# Patient Record
Sex: Female | Born: 1940 | Race: White | Hispanic: No | Marital: Married | Smoking: Never smoker
Health system: Southern US, Community
[De-identification: ages and names within clinical notes are randomized; demographics above are authoritative.]

## PROBLEM LIST (undated history)

## (undated) DIAGNOSIS — IMO0001 Reserved for inherently not codable concepts without codable children: Secondary | ICD-10-CM

## (undated) DIAGNOSIS — M1990 Unspecified osteoarthritis, unspecified site: Secondary | ICD-10-CM

## (undated) DIAGNOSIS — K589 Irritable bowel syndrome without diarrhea: Secondary | ICD-10-CM

## (undated) DIAGNOSIS — C4490 Unspecified malignant neoplasm of skin, unspecified: Secondary | ICD-10-CM

## (undated) DIAGNOSIS — I639 Cerebral infarction, unspecified: Secondary | ICD-10-CM

## (undated) DIAGNOSIS — G2581 Restless legs syndrome: Secondary | ICD-10-CM

## (undated) DIAGNOSIS — F419 Anxiety disorder, unspecified: Secondary | ICD-10-CM

## (undated) DIAGNOSIS — M25473 Effusion, unspecified ankle: Secondary | ICD-10-CM

## (undated) DIAGNOSIS — R3915 Urgency of urination: Secondary | ICD-10-CM

## (undated) DIAGNOSIS — I253 Aneurysm of heart: Secondary | ICD-10-CM

## (undated) DIAGNOSIS — K219 Gastro-esophageal reflux disease without esophagitis: Secondary | ICD-10-CM

## (undated) DIAGNOSIS — E785 Hyperlipidemia, unspecified: Secondary | ICD-10-CM

## (undated) DIAGNOSIS — I4891 Unspecified atrial fibrillation: Secondary | ICD-10-CM

## (undated) DIAGNOSIS — I10 Essential (primary) hypertension: Secondary | ICD-10-CM

## (undated) HISTORY — PX: ABDOMINAL HYSTERECTOMY: SHX81

## (undated) HISTORY — PX: EYE SURGERY: SHX253

## (undated) HISTORY — DX: Unspecified atrial fibrillation: I48.91

## (undated) HISTORY — PX: TOTAL KNEE ARTHROPLASTY: SHX125

## (undated) HISTORY — DX: Irritable bowel syndrome, unspecified: K58.9

## (undated) HISTORY — PX: TONSILLECTOMY: SUR1361

## (undated) HISTORY — PX: FOOT SURGERY: SHX648

## (undated) HISTORY — DX: Aneurysm of heart: I25.3

## (undated) HISTORY — PX: APPENDECTOMY: SHX54

## (undated) HISTORY — DX: Essential (primary) hypertension: I10

## (undated) HISTORY — PX: COLONOSCOPY: SHX174

---

## 2000-02-11 ENCOUNTER — Inpatient Hospital Stay (HOSPITAL_COMMUNITY): Admission: RE | Admit: 2000-02-11 | Discharge: 2000-02-16 | Payer: Self-pay | Admitting: Orthopaedic Surgery

## 2004-07-25 ENCOUNTER — Ambulatory Visit (HOSPITAL_COMMUNITY): Admission: RE | Admit: 2004-07-25 | Discharge: 2004-07-26 | Payer: Self-pay | Admitting: Orthopaedic Surgery

## 2011-11-19 ENCOUNTER — Encounter: Payer: Self-pay | Admitting: *Deleted

## 2011-11-19 ENCOUNTER — Encounter: Payer: Self-pay | Admitting: Cardiology

## 2011-11-20 ENCOUNTER — Ambulatory Visit (INDEPENDENT_AMBULATORY_CARE_PROVIDER_SITE_OTHER): Payer: Medicare Other | Admitting: Cardiology

## 2011-11-20 ENCOUNTER — Encounter: Payer: Self-pay | Admitting: Cardiology

## 2011-11-20 VITALS — BP 125/74 | HR 69 | Ht 65.0 in | Wt 190.0 lb

## 2011-11-20 DIAGNOSIS — I10 Essential (primary) hypertension: Secondary | ICD-10-CM | POA: Insufficient documentation

## 2011-11-20 DIAGNOSIS — R55 Syncope and collapse: Secondary | ICD-10-CM | POA: Insufficient documentation

## 2011-11-20 DIAGNOSIS — I4891 Unspecified atrial fibrillation: Secondary | ICD-10-CM | POA: Insufficient documentation

## 2011-11-20 NOTE — Patient Instructions (Signed)
Your physician recommends that you schedule a follow-up appointment in: 8 weeks in [place]

## 2011-11-20 NOTE — Assessment & Plan Note (Signed)
Etiology unclear. LV function normal. No events in the past 6-7 months. She may require a loop in the future if she has recurrences.

## 2011-11-20 NOTE — Assessment & Plan Note (Signed)
Blood pressure controlled. Continue present medications. 

## 2011-11-20 NOTE — Assessment & Plan Note (Signed)
Patient carries the diagnosis of atrial fibrillation. However in reviewing her Holter report it states she had 70 beats of supraventricular ectopic activity. 44 were in 6 runs. Office notes did state it appeared to be atrial fibrillation. These at most would be very short runs. I will obtain actual Holter monitor to review before diagnosing with atrial fibrillation. If she does have this she has multiple embolic risk factors including age greater than 36, hypertension and female sex. She would therefore require one of the new anticoagulants. We will see her back in 8 weeks and make that decision.

## 2011-11-20 NOTE — Progress Notes (Signed)
  HPI: 71 year old female previously followed by [facility] cardiology for evaluation of atrial fibrillation. Patient had a stress MRI in the past that showed an atrial septal aneurysm but there was no ischemia. An echocardiogram in July of 2012 apparently showed no shunt. There was normal LV function. No significant valvular disease noted. A monitor in July of 2012 was performed because of recurrent falls and showed supraventricular ectopic activity of 70 beats. 44 were in 6 runs and there were atrial couplets. Based on outside office notes there was possible atrial fibrillation with a rapid ventricular response. Patient denies dyspnea on exertion, orthopnea, PND or exertional chest pain. Occasional mild pedal edema. She does occasionally have palpitations predominantly with exertion. She has had 3 falls previously. Her most recent was approximately 6-7 months ago. She was pushing her trash can and does not recall the exact events. She did fall but is unclear if she lost consciousness. No associated chest pain, palpitations, dyspnea, nausea, incontinence. She now presents for further evaluation.  Current Outpatient Prescriptions  Medication Sig Dispense Refill  . CELEXA 20 MG tablet       . hydrochlorothiazide (HYDRODIURIL) 50 MG tablet       . HYDROcodone-acetaminophen (VICODIN) 5-500 MG per tablet       . KLOR-CON M20 20 MEQ tablet       . metoprolol succinate (TOPROL-XL) 25 MG 24 hr tablet       . PREMARIN 0.45 MG tablet       . VESICARE 5 MG tablet         No Known Allergies  Past Medical History  Diagnosis Date  . [place]ath-induced atrial fibrillation   . HTN (hypertension)   . Atrial septal aneurysm     No past surgical history on file.  History   Social History  . Marital Status: Married    Spouse Name: N/A    Number of Children: N/A  . Years of Education: N/A   Occupational History  . Not on file.   Social History Main Topics  . Smoking status: Not on file  . Smokeless  tobacco: Not on file  . Alcohol Use: Not on file  . Drug Use: Not on file  . Sexually Active: Not on file   Other Topics Concern  . Not on file   Social History Narrative  . No narrative on file    No family history on file.  ROS: no fevers or chills, productive cough, hemoptysis, dysphasia, odynophagia, melena, hematochezia, dysuria, hematuria, rash, seizure activity, orthopnea, PND, pedal edema, claudication. Remaining systems are negative.  Physical Exam:   There were no vitals taken for this visit.  General:  Well developed/well nourished in NAD Skin warm/dry Patient not depressed No peripheral clubbing Back-normal HEENT-normal/normal eyelids Neck supple/normal carotid upstroke bilaterally; no bruits; no JVD; no thyromegaly chest - CTA/ normal expansion CV - RRR/normal S1 and S2; no murmurs, rubs or gallops;  PMI nondisplaced Abdomen -NT/ND, no HSM, no mass, + bowel sounds, no bruit 2+ femoral pulses, no bruits Ext-no edema, chords, 2+ DP Neuro-grossly nonfocal  ECG normal sinus rhythm at a rate of 60. No significant ST changes. Low voltage.

## 2012-02-03 ENCOUNTER — Encounter: Payer: Self-pay | Admitting: Cardiology

## 2012-02-03 ENCOUNTER — Ambulatory Visit (INDEPENDENT_AMBULATORY_CARE_PROVIDER_SITE_OTHER): Payer: Medicare Other | Admitting: Cardiology

## 2012-02-03 VITALS — BP 120/78 | HR 72 | Ht 65.0 in | Wt 195.0 lb

## 2012-02-03 DIAGNOSIS — R55 Syncope and collapse: Secondary | ICD-10-CM

## 2012-02-03 DIAGNOSIS — I10 Essential (primary) hypertension: Secondary | ICD-10-CM

## 2012-02-03 DIAGNOSIS — I4891 Unspecified atrial fibrillation: Secondary | ICD-10-CM

## 2012-02-03 NOTE — Assessment & Plan Note (Signed)
I have reviewed the patient's outside monitor. She appears to have brief runs of PAT. I do not find any rhythm disturbance that would push me towards anticoagulations. Note LV function normal.

## 2012-02-03 NOTE — Assessment & Plan Note (Signed)
Patient with previous falls of unclear etiology. If these recur in the future we may consider a loop monitor.

## 2012-02-03 NOTE — Assessment & Plan Note (Signed)
Blood pressure controlled. Continue present medications. 

## 2012-02-03 NOTE — Patient Instructions (Signed)
Your physician wants you to follow-up in: 6 MONTHS You will receive a reminder letter in the mail two months in advance. If you don't receive a letter, please call our office to schedule the follow-up appointment. 

## 2012-02-03 NOTE — Progress Notes (Signed)
HPI:71 year old female I initially saw in Jan 2013 for possible atrial fibrillation. She has had 3 falls previously. Unclear if these were frank syncopal episodes. Her most recent was approximately 6-7 months ago. Patient had a stress MRI in the past that showed an atrial septal aneurysm but there was no ischemia. An echocardiogram in July of 2012 apparently showed no shunt. There was normal LV function. No significant valvular disease noted. A monitor in July of 2012 was performed because of recurrent falls and showed supraventricular ectopic activity of 70 beats. 44 were in 6 runs and there were atrial couplets. Based on outside office notes there was possible atrial fibrillation with a rapid ventricular response. I have reviewed her monitor and it suggests sinus rhythm with brief runs of PAT. Since I saw her previously,    Current Outpatient Prescriptions  Medication Sig Dispense Refill  . CELEXA 20 MG tablet Take 10 mg by mouth daily.       . cholecalciferol (VITAMIN D) 1000 UNITS tablet Take 1,000 Units by mouth daily.        . hydrochlorothiazide (HYDRODIURIL) 50 MG tablet Take 25 mg by mouth daily. Take extra 25 mg tablet when needed      . HYDROcodone-acetaminophen (VICODIN) 5-500 MG per tablet Take 1 tablet by mouth 2 (two) times daily.       Marland Kitchen KLOR-CON M20 20 MEQ tablet Take 20 mEq by mouth 2 (two) times daily.       . Magnesium 100 MG CAPS Take 1 capsule by mouth daily.        . metoprolol succinate (TOPROL-XL) 25 MG 24 hr tablet Take 25 mg by mouth daily.       Marland Kitchen omeprazole (PRILOSEC OTC) 20 MG tablet Take 20 mg by mouth daily.        Marland Kitchen PREMARIN 0.45 MG tablet Take 0.45 mg by mouth daily.       . VESICARE 5 MG tablet Take 10 mg by mouth daily.       . vitamin E 100 UNIT capsule Take 100 Units by mouth daily.           Past Medical History  Diagnosis Date  . Atrial fibrillation   . HTN (hypertension)   . Atrial septal aneurysm   . IBS (irritable bowel syndrome)     Past  Surgical History  Procedure Date  . Tonsillectomy   . Appendectomy   . Total knee arthroplasty   . Abdominal hysterectomy   . Foot surgery     History   Social History  . Marital Status: Married    Spouse Name: N/A    Number of Children: 2  . Years of Education: N/A   Occupational History  .  [employer]   Social History Main Topics  . Smoking status: Never Smoker   . Smokeless tobacco: Never Used  . Alcohol Use: Yes     Occasional  . Drug Use: Not on file  . Sexually Active: Not on file   Other Topics Concern  . Not on file   Social History Narrative  . No narrative on file    ROS: no fevers or chills, productive cough, hemoptysis, dysphasia, odynophagia, melena, hematochezia, dysuria, hematuria, rash, seizure activity, orthopnea, PND, pedal edema, claudication. Remaining systems are negative.  Physical Exam: Well-developed well-nourished in no acute distress.  Skin is warm and dry.  HEENT is normal.  Neck is supple. No thyromegaly.  Chest is clear to auscultation with normal expansion.  Cardiovascular exam is regular rate and rhythm.  Abdominal exam nontender or distended. No masses palpated. Extremities show no edema. neuro grossly intact

## 2012-07-02 ENCOUNTER — Emergency Department (HOSPITAL_COMMUNITY): Payer: No Typology Code available for payment source

## 2012-07-02 ENCOUNTER — Encounter (HOSPITAL_COMMUNITY): Payer: Self-pay | Admitting: *Deleted

## 2012-07-02 ENCOUNTER — Emergency Department (HOSPITAL_COMMUNITY)
Admission: EM | Admit: 2012-07-02 | Discharge: 2012-07-02 | Disposition: A | Discharge status: Home / Self Care | Payer: No Typology Code available for payment source | Attending: Emergency Medicine | Admitting: Emergency Medicine

## 2012-07-02 DIAGNOSIS — K589 Irritable bowel syndrome without diarrhea: Secondary | ICD-10-CM | POA: Insufficient documentation

## 2012-07-02 DIAGNOSIS — I10 Essential (primary) hypertension: Secondary | ICD-10-CM | POA: Insufficient documentation

## 2012-07-02 DIAGNOSIS — S161XXA Strain of muscle, fascia and tendon at neck level, initial encounter: Secondary | ICD-10-CM

## 2012-07-02 DIAGNOSIS — Y92410 Unspecified street and highway as the place of occurrence of the external cause: Secondary | ICD-10-CM | POA: Insufficient documentation

## 2012-07-02 DIAGNOSIS — Z9089 Acquired absence of other organs: Secondary | ICD-10-CM | POA: Insufficient documentation

## 2012-07-02 DIAGNOSIS — S139XXA Sprain of joints and ligaments of unspecified parts of neck, initial encounter: Secondary | ICD-10-CM | POA: Insufficient documentation

## 2012-07-02 DIAGNOSIS — I4891 Unspecified atrial fibrillation: Secondary | ICD-10-CM | POA: Insufficient documentation

## 2012-07-02 DIAGNOSIS — Z79899 Other long term (current) drug therapy: Secondary | ICD-10-CM | POA: Insufficient documentation

## 2012-07-02 DIAGNOSIS — T148XXA Other injury of unspecified body region, initial encounter: Secondary | ICD-10-CM

## 2012-07-02 DIAGNOSIS — M542 Cervicalgia: Secondary | ICD-10-CM | POA: Insufficient documentation

## 2012-07-02 MED ORDER — CYCLOBENZAPRINE HCL 10 MG PO TABS: 10.0000 mg | ORAL_TABLET | Freq: Two times a day (BID) | ORAL | Status: AC | PRN

## 2012-07-02 MED ORDER — IBUPROFEN 600 MG PO TABS: 600.0000 mg | ORAL_TABLET | Freq: Four times a day (QID) | ORAL | Status: AC | PRN

## 2012-07-02 MED ORDER — MORPHINE SULFATE 4 MG/ML IJ SOLN
4.0000 mg | Freq: Once | INTRAMUSCULAR | Status: AC
Start: 2012-07-02 — End: 2012-07-02
  Administered 2012-07-02: 4 mg via INTRAVENOUS
  Filled 2012-07-02: qty 1

## 2012-07-02 NOTE — ED Notes (Signed)
Patient in xray, husband at bedside

## 2012-07-02 NOTE — ED Notes (Signed)
MD at bedside. 
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
## 2012-07-02 NOTE — ED Notes (Signed)
Removed from spine board patient c/o lower back pain above sacrum. C-collar left in place.

## 2015-03-28 ENCOUNTER — Ambulatory Visit: Payer: Self-pay | Admitting: Cardiology

## 2015-12-09 ENCOUNTER — Ambulatory Visit: Payer: Self-pay | Admitting: Physical Therapy

## 2015-12-11 ENCOUNTER — Encounter: Payer: Self-pay | Admitting: Physical Therapy

## 2015-12-11 ENCOUNTER — Ambulatory Visit (INDEPENDENT_AMBULATORY_CARE_PROVIDER_SITE_OTHER): Payer: Medicare HMO | Admitting: Physical Therapy

## 2015-12-11 DIAGNOSIS — R531 Weakness: Secondary | ICD-10-CM | POA: Diagnosis not present

## 2015-12-11 DIAGNOSIS — R269 Unspecified abnormalities of gait and mobility: Secondary | ICD-10-CM

## 2015-12-11 DIAGNOSIS — R52 Pain, unspecified: Secondary | ICD-10-CM

## 2015-12-11 NOTE — Patient Instructions (Signed)
Lumbar Rotation (Non-Weight Bearing)  [place] [phone]     Feet on floor, slowly rock knees from side to side in small, pain-free range of motion. Allow lower back to rotate slightly. Repeat _10___ times per set. Do __1__ sets per session. Do __1__ sessions per day.  Knee-to-Chest Stretch: Unilateral    With hand behind right knee, pull knee in to chest until a comfortable stretch is felt in lower back and buttocks. Keep back relaxed. Hold __20__ seconds. Repeat __1__ times per set. Do __1__ sets per session. Do ___1_ sessions per day. Repeat on the other leg.  Hamstring Stretch: Active    Support behind right knee. Starting with knee bent, attempt to straighten knee until a comfortable stretch is felt in back of thigh. Hold __20__ seconds. Repeat __1__ times per set. Do __1__ sets per session. Do ___1_ sessions per day. Repeat on the other leg.  Copyright  VHI. All rights reserved.

## 2015-12-11 NOTE — Therapy (Signed)
[place], [place], [address] Phone: [phone]   Fax:  [phone]  Physical Therapy Evaluation  Patient Details  Name: Victoria Francis MRN: [id number] Date of Birth: 02/28/1941 Referring Provider: Dr Rodell Perna  Encounter Date: 12/11/2015      PT End of Session - 12/11/15 1105    Visit Number 1   Number of Visits 12   Date for PT Re-Evaluation 01/22/16   PT Start Time 1105   PT Stop Time 1206   PT Time Calculation (min) 61 min   Activity Tolerance Patient limited by pain      Past Medical History  Diagnosis Date  . Atrial fibrillation ([place])   . HTN (hypertension)   . Atrial septal aneurysm   . IBS (irritable bowel syndrome)     Past Surgical History  Procedure Laterality Date  . Tonsillectomy    . Appendectomy    . Total knee arthroplasty    . Abdominal hysterectomy    . Foot surgery      There were no vitals filed for this visit.  Visit Diagnosis:  Generalized pain - Plan: PT plan of care cert/re-cert  Weakness generalized - Plan: PT plan of care cert/re-cert  Abnormality of gait - Plan: PT plan of care cert/re-cert      Subjective Assessment - 12/11/15 1106    Subjective Patient reports she fell in December after Christmas. She was in the shower and heard the telephone ring so she rushed out of the shower and did a split.  She was able to get up on her own. Went to the hospital the end of the month because she was hurting so bad.  She was issued  pain meds and was using  a walker.  She has weaned herself to a cane in the house and presents to the clinic with this.  However for  longer walks like the store she uses a walker.    Pertinent History Lt TKA, bilat shoulders, has a bulging disc in her low back.    How long can you sit comfortably? about an hr then whole leg goes numb   How long can you stand comfortably? 15 min   How long can you walk comfortably? using  assistive device, 5 minutes.    Diagnostic tests x-rays, MRI showed torn HS.    Patient Stated Goals she wants the pain to go away, walk without pain, assist husband ( on dialysis) donn his socks.    Currently in Pain? Yes   Pain Score 7    Pain Location Hip   Pain Orientation Right   Pain Descriptors / Indicators Aching;Shooting;Sharp   Pain Type Acute pain   Pain Radiating Towards into the Rt shin   Pain Onset More than a month ago   Pain Frequency Intermittent   Aggravating Factors  any moving, her dog jumping up on her.    Pain Relieving Factors sleeping - doesnt have pain, compression wrap on her Rt thigh            [facility] PT Assessment - 12/11/15 0001    Assessment   Medical Diagnosis Rt Hamstring origin small tear, gluteal tendonopathy   Referring Provider Dr Rodell Perna   Onset Date/Surgical Date 11/12/15   Hand Dominance Right   Next MD Visit 01/08/16   Prior Therapy none   Precautions   Precautions None   Required Braces or Orthoses --  compression sleeve for Rt thigh  and knee as needed   Balance Screen   Has the patient fallen in the past 6 months Yes   How many times? 1  at the time of injury   Has the patient had a decrease in activity level because of a fear of falling?  Yes   Is the patient reluctant to leave their home because of a fear of falling?  No   Home Environment   Living Environment Private residence   Living Arrangements Spouse/significant other   Type of [place] to enter  2, railing, uses cane also, one at a time.    Prior Function   Level of Independence Independent   Vocation Retired   Leisure play with dog, and two great grandchildren, 7 % 73yo.    Observation/Other Assessments   Focus on Therapeutic Outcomes (FOTO)  67% limited   Functional Tests   Functional tests Squat;Single leg stance   Squat   Comments shifts to the Lt, has Rt LE pain   Single Leg Stance   Comments Rt 3 sec, Lt 1sec with pain.    Posture/Postural Control   Posture/Postural Control Postural limitations   Postural Limitations Rounded Shoulders;Forward head;Flexed trunk;Decreased lumbar lordosis   ROM / Strength   AROM / PROM / Strength AROM;Strength   AROM   Overall AROM Comments LE's WNL   Strength   Strength Assessment Site Hip;Knee;Ankle   Right/Left Hip Right  Lt WNL   Right Hip Flexion 3-/5   Right Hip Extension 4-/5   Right Hip ABduction 4/5   Right/Left Knee --  WNL   Right/Left Ankle --  Rt DF 4+/5, ever 4-/5 with pain, inver WNL, PF 4-/5   Palpation   Spinal mobility tenderness with Rt UPA mobs in lumbar spine and L3 CPA    Palpation comment tightness in Rt peromeals with tenderness. very tight in Rt buttocks , low back and Rt quads   Ambulation/Gait   Ambulation Distance (Feet) --  observed in the clinic   Assistive device Straight cane   Gait Pattern Decreased stride length;Right circumduction;Shuffle;Trunk flexed                   [facility] Adult PT Treatment/Exercise - 12/11/15 0001    Exercises   Exercises Lumbar   Lumbar Exercises: Stretches   Passive Hamstring Stretch 1 rep;20 seconds  each side with hands behind knee.    Single Knee to Chest Stretch 1 rep;20 seconds  each side   Lower Trunk Rotation --  10 repsr   Modalities   Modalities Electrical Stimulation;Moist Heat   Moist Heat Therapy   Number Minutes Moist Heat 15 Minutes   Moist Heat Location Lumbar Spine  Rt ant thigh   Electrical Stimulation   Electrical Stimulation Location Lumbar and Rt ant thigh   Electrical Stimulation Action premod   Electrical Stimulation Parameters to tolerance   Electrical Stimulation Goals Pain;Tone                PT Education - 12/11/15 1321    Education provided Yes   Education Details HEP   Person(s) Educated Patient   Methods Demonstration;Handout;Explanation   Comprehension Returned demonstration;Verbalized understanding             PT Long Term Goals -  12/11/15 1329    PT LONG TERM GOAL #1   Title I with HEP ( 01/22/16)    Time 6   Period Weeks   Status New  PT LONG TERM GOAL #2   Title report overall pain reduction =/> 50% with daily activity ( 01/22/16)    Time 6   Period Weeks   Status New   PT LONG TERM GOAL #3   Title increase Rt LE strength =/> 4+/5 ( 01/22/16)    Time 6   Period Weeks   Status New   PT LONG TERM GOAL #4   Title report ability to play with her dog per her previous level (01/22/16)    Time 6   Period Weeks   Status New   PT LONG TERM GOAL #5   Title demo FOTO =/< 45% limited, CK level (01/22/16)    Time 4   Period Weeks   Status New               Plan - 12/11/15 1321    Clinical Impression Statement 75 yo female with complicated presentation.  She had a fall the end of Dec rushing to the telephone after being in the shower.  She sustained a small Rt HS tear at the origin and MRI also showed gluteal tendonopathy. Victoria Francis has a h/o LBP as well.  Currenlty she has pain in her Rt lower leg, thigh - anterior and posterior, buttocks and lumbar region.  She has a lot of palpable tenderness and muscle tightness in all those areas.  She did not have pain with activating her Rt HS however does with activating the Quad. She is ambulating with a straight cane and has gait deviations.  Some of her issues may be from a flare up of her back issues from the fall. Difficult to assess at this time due to the level of guarding and inconsistent symptom identification .   Pt will benefit from skilled therapeutic intervention in order to improve on the following deficits Postural dysfunction;Decreased strength;Pain;Decreased activity tolerance;Increased muscle spasms   Rehab Potential Good   PT Frequency 2x / week   PT Duration 6 weeks   PT Treatment/Interventions Ultrasound;Traction;Patient/family education;Passive range of motion;Dry needling;Gait training;Cryotherapy;Electrical Stimulation;Functional mobility training;Moist  Heat;Therapeutic exercise;Manual techniques;Neuromuscular re-education   PT Next Visit Plan see how HEP is, assess lumbar, Rt quad and shin for manual therapy.    Consulted and Agree with Plan of Care Patient         Problem List Patient Active Problem List   Diagnosis Date Noted  . Syncope 11/20/2011  . Hypertension 11/20/2011  . Atrial fibrillation ([place]) 11/20/2011    Jeral Pinch PT 12/11/2015, [place], [place], [address] Phone: [phone]   Fax:  [phone]  Name: Victoria Francis MRN: [id number] Date of Birth: 04-25-1941

## 2015-12-18 ENCOUNTER — Encounter: Payer: Self-pay | Admitting: Physical Therapy

## 2015-12-18 ENCOUNTER — Ambulatory Visit (INDEPENDENT_AMBULATORY_CARE_PROVIDER_SITE_OTHER): Payer: Medicare HMO | Admitting: Physical Therapy

## 2015-12-18 DIAGNOSIS — R52 Pain, unspecified: Secondary | ICD-10-CM

## 2015-12-18 DIAGNOSIS — R531 Weakness: Secondary | ICD-10-CM

## 2015-12-18 NOTE — Therapy (Signed)
[place], [place], [address] Phone: [phone]   Fax:  [phone]  Physical Therapy Treatment  Patient Details  Name: Victoria Francis MRN: [id number] Date of Birth: 1941/08/18 Referring Provider: Dr Rodell Perna  Encounter Date: 12/18/2015      PT End of Session - 12/18/15 1435    Visit Number 2   Number of Visits 12   Date for PT Re-Evaluation 01/22/16   PT Start Time 1435   PT Stop Time 1535   PT Time Calculation (min) 60 min      Past Medical History  Diagnosis Date  . Atrial fibrillation ([place])   . HTN (hypertension)   . Atrial septal aneurysm   . IBS (irritable bowel syndrome)     Past Surgical History  Procedure Laterality Date  . Tonsillectomy    . Appendectomy    . Total knee arthroplasty    . Abdominal hysterectomy    . Foot surgery      There were no vitals filed for this visit.  Visit Diagnosis:  Generalized pain  Weakness generalized      Subjective Assessment - 12/18/15 1438    Subjective Pt reports she is having pain in her Rt groin and shin , the exercise seem to make it worse. It is no different than it has been.    Currently in Pain? Yes   Pain Score 6    Pain Location Back   Pain Orientation Right   Pain Descriptors / Indicators Aching;Shooting   Pain Type Acute pain   Pain Radiating Towards into Rt groin, knee and shin   Pain Onset More than a month ago   Pain Frequency Constant   Aggravating Factors  moving,    Pain Relieving Factors not much                         [facility] Adult PT Treatment/Exercise - 12/18/15 0001    Lumbar Exercises: Supine   Other Supine Lumbar Exercises hooklying pelvic rocks - made it feel worse,    Lumbar Exercises: Prone   Other Prone Lumbar Exercises POE, no real relief.    Modalities   Modalities Electrical Stimulation;Moist Heat   Moist Heat Therapy   Number Minutes Moist Heat 15 Minutes   Moist  Heat Location Lumbar Spine  Rt inner thigh   Electrical Stimulation   Electrical Stimulation Location Lumbar and Rt inner thigh   Electrical Stimulation Action premod   Electrical Stimulation Parameters to tolerance   Electrical Stimulation Goals Pain;Tone   Manual Therapy   Manual Therapy Joint mobilization;Soft tissue mobilization   Joint Mobilization grade II sacrum, lumbar CPA   Soft tissue mobilization Rt hamstring, QL, gluts, hip adductors - none of these seemed to help the patient, she reports it all made it worse.           Trigger Point Dry Needling - 12/18/15 1703    Consent Given? Yes   Education Handout Provided Yes   Muscles Treated Upper Body Quadratus Lumborum  palpable muscle lengthening    Muscles Treated Lower Body Adductor longus/brevius/maximus   Adductor Response Palpable increased muscle length              PT Education - 12/18/15 1705    Education Details TDN   Person(s) Educated Patient   Methods Explanation;Handout   Comprehension Verbalized understanding  PT Long Term Goals - 12/18/15 1707    PT LONG TERM GOAL #1   Title I with HEP ( 01/22/16)    Status On-going   PT LONG TERM GOAL #2   Title report overall pain reduction =/> 50% with daily activity ( 01/22/16)    Status On-going   PT LONG TERM GOAL #3   Title increase Rt LE strength =/> 4+/5 ( 01/22/16)    Status On-going   PT LONG TERM GOAL #4   Title report ability to play with her dog per her previous level (01/22/16)    Status On-going   PT LONG TERM GOAL #5   Title demo FOTO =/< 45% limited, CK level (01/22/16)    Status On-going               Plan - 12/18/15 1705    Clinical Impression Statement Pt reported pain with all attempts of treatment, no relief from positions, manual therapy or gentle attempt at exercise/stretching.  She did get some Rt groin relief with TDN.  This is only her second visit. No goals have been met. She has many limitations due to pain  and muscle tightness.    Pt will benefit from skilled therapeutic intervention in order to improve on the following deficits Postural dysfunction;Decreased strength;Pain;Decreased activity tolerance;Increased muscle spasms   Rehab Potential Good   PT Frequency 2x / week   PT Duration 6 weeks   PT Treatment/Interventions Ultrasound;Traction;Patient/family education;Passive range of motion;Dry needling;Gait training;Cryotherapy;Electrical Stimulation;Functional mobility training;Moist Heat;Therapeutic exercise;Manual techniques;Neuromuscular re-education   PT Next Visit Plan asses response to TDN, perform again if it helped, write MD note.    Consulted and Agree with Plan of Care Patient        Problem List Patient Active Problem List   Diagnosis Date Noted  . Syncope 11/20/2011  . Hypertension 11/20/2011  . Atrial fibrillation ([place]) 11/20/2011    Jeral Pinch PT 12/18/2015, 5:08 PM  [facility] [place], [place], [address] Phone: [phone]   Fax:  [phone]  Name: Victoria Francis MRN: [id number] Date of Birth: July 20, 1941

## 2015-12-18 NOTE — Patient Instructions (Signed)

## 2015-12-25 ENCOUNTER — Encounter: Payer: Self-pay | Admitting: Physical Therapy

## 2015-12-31 ENCOUNTER — Encounter: Payer: Self-pay | Admitting: Physical Therapy

## 2015-12-31 ENCOUNTER — Ambulatory Visit (INDEPENDENT_AMBULATORY_CARE_PROVIDER_SITE_OTHER): Payer: Medicare HMO | Admitting: Physical Therapy

## 2015-12-31 DIAGNOSIS — R531 Weakness: Secondary | ICD-10-CM | POA: Diagnosis not present

## 2015-12-31 DIAGNOSIS — R269 Unspecified abnormalities of gait and mobility: Secondary | ICD-10-CM | POA: Diagnosis not present

## 2015-12-31 DIAGNOSIS — R52 Pain, unspecified: Secondary | ICD-10-CM

## 2015-12-31 NOTE — Therapy (Signed)
[place], [place], [address] Phone: [phone]   Fax:  [phone]  Physical Therapy Treatment  Patient Details  Name: Victoria Francis MRN: [id number] Date of Birth: Apr 11, 1941 Referring Provider: Dr Wardell Honour  Encounter Date: 12/31/2015      PT End of Session - 12/31/15 1105    Visit Number 3   Number of Visits 12   Date for PT Re-Evaluation 01/22/16   PT Start Time 1105   PT Stop Time 1202   PT Time Calculation (min) 57 min      Past Medical History  Diagnosis Date  . Atrial fibrillation ([place])   . HTN (hypertension)   . Atrial septal aneurysm   . IBS (irritable bowel syndrome)     Past Surgical History  Procedure Laterality Date  . Tonsillectomy    . Appendectomy    . Total knee arthroplasty    . Abdominal hysterectomy    . Foot surgery      There were no vitals filed for this visit.  Visit Diagnosis:  Generalized pain  Weakness generalized  Abnormality of gait      Subjective Assessment - 12/31/15 1105    Subjective Victoria Francis thinks the dry needling helped her some until she had to go to the hospital with her husband and had to walk a lot last week.  She thinks she had 30-40% improvement.    Currently in Pain? Yes   Pain Score 6    Pain Location Groin   Pain Orientation Right   Pain Type Acute pain   Pain Radiating Towards into Rt knee   Aggravating Factors  walking and moving. standing for meal prep   Pain Relieving Factors heat some, rest            [facility] PT Assessment - 12/31/15 0001    Assessment   Medical Diagnosis Rt Hamstring origin small tear, gluteal tendonopathy   Referring Provider Dr Wardell Honour   Onset Date/Surgical Date 11/12/15   Hand Dominance Right   Next MD Visit 01/08/16   ROM / Strength   AROM / PROM / Strength Strength   Strength   Strength Assessment Site Hip;Knee   Right/Left Hip Right   Right Hip Flexion 4-/5   Right Hip  Extension 4/5   Right Hip ABduction 4+/5   Right/Left Knee --  Rt knee WNL                     [facility] Adult PT Treatment/Exercise - 12/31/15 0001    Lumbar Exercises: Stretches   Quad Stretch 3 reps;30 seconds  with therapist assist   Modalities   Modalities Electrical Stimulation;Moist Heat   Moist Heat Therapy   Number Minutes Moist Heat 15 Minutes   Moist Heat Location Lumbar Spine  buttock Rt    Electrical Stimulation   Electrical Stimulation Location Lumbar and Rt inner thigh   Electrical Stimulation Action IFC   Electrical Stimulation Parameters to tolerance   Electrical Stimulation Goals Pain;Tone   Manual Therapy   Soft tissue mobilization Rt gluts, lumbar           Trigger Point Dry Needling - 12/31/15 1117    Consent Given? Yes   Education Handout Provided No   Muscles Treated Upper Body Quadratus Lumborum;Longissimus;Gluteus minimus  Rt side   Longissimus Response Twitch response elicited;Palpable increased muscle length  PT Long Term Goals - 12/31/15 1110    PT LONG TERM GOAL #1   Title I with HEP ( 01/22/16)    Status On-going   PT LONG TERM GOAL #2   Title report overall pain reduction =/> 50% with daily activity ( 01/22/16)    Status On-going   PT LONG TERM GOAL #3   Title increase Rt LE strength =/> 4+/5 ( 01/22/16)    Status Partially Met   PT LONG TERM GOAL #4   Title report ability to play with her dog per her previous level (01/22/16)    Status On-going   PT LONG TERM GOAL #5   Title demo FOTO =/< 45% limited, CK level (01/22/16)    Status On-going               Plan - 12/31/15 1234    Clinical Impression Statement Victoria Francis states she had relief after her last treatment and wishes to have TDN performed again. No new goals met however is progressing.  Rt groin pain is still her biggest complaint.    Pt will benefit from skilled therapeutic intervention in order to improve on the following deficits  Postural dysfunction;Decreased strength;Pain;Decreased activity tolerance;Increased muscle spasms   PT Frequency 2x / week   PT Duration 6 weeks   PT Treatment/Interventions Ultrasound;Traction;Patient/family education;Passive range of motion;Dry needling;Gait training;Cryotherapy;Electrical Stimulation;Functional mobility training;Moist Heat;Therapeutic exercise;Manual techniques;Neuromuscular re-education   PT Next Visit Plan progress HEP    Consulted and Agree with Plan of Care Patient        Problem List Patient Active Problem List   Diagnosis Date Noted  . Syncope 11/20/2011  . Hypertension 11/20/2011  . Atrial fibrillation ([place]) 11/20/2011    Jeral Pinch PT 12/31/2015, 12:38 PM  [facility] [place], [place], [address] Phone: [phone]   Fax:  [phone]  Name: Victoria Francis MRN: [id number] Date of Birth: [birth year]

## 2016-01-07 ENCOUNTER — Ambulatory Visit (INDEPENDENT_AMBULATORY_CARE_PROVIDER_SITE_OTHER): Payer: Medicare HMO | Admitting: Physical Therapy

## 2016-01-07 DIAGNOSIS — R269 Unspecified abnormalities of gait and mobility: Secondary | ICD-10-CM

## 2016-01-07 DIAGNOSIS — R531 Weakness: Secondary | ICD-10-CM | POA: Diagnosis not present

## 2016-01-07 DIAGNOSIS — R52 Pain, unspecified: Secondary | ICD-10-CM

## 2016-01-07 NOTE — Therapy (Addendum)
[place], [place], [address] Phone: [phone]   Fax:  [phone]  Physical Therapy Treatment  Patient Details  Name: Victoria Francis MRN: [id number] Date of Birth: 07/13/1941 Referring Provider: Dr Rodell Perna   Encounter Date: 01/07/2016      PT End of Session - 01/07/16 1139    Visit Number 4   Number of Visits 12   Date for PT Re-Evaluation 01/22/16   PT Start Time 1103   PT Stop Time 1152   PT Time Calculation (min) 49 min   Activity Tolerance Patient limited by pain      Past Medical History  Diagnosis Date  . Atrial fibrillation ([place])   . HTN (hypertension)   . Atrial septal aneurysm   . IBS (irritable bowel syndrome)     Past Surgical History  Procedure Laterality Date  . Tonsillectomy    . Appendectomy    . Total knee arthroplasty    . Abdominal hysterectomy    . Foot surgery      There were no vitals filed for this visit.  Visit Diagnosis:  Generalized pain  Weakness generalized  Abnormality of gait      Subjective Assessment - 01/07/16 1103    Subjective Pt reports her back feels better however the anterior Rt groin is worse. This morning it felt like a knife cutting into her groin.    Currently in Pain? Yes   Pain Score 5   9-10/10 before medication   Pain Location Groin   Pain Orientation Right;Anterior   Pain Descriptors / Indicators Stabbing   Pain Type Acute pain   Pain Frequency Constant   Aggravating Factors  bending over and squating   Pain Relieving Factors heat, medication                          [facility] Adult PT Treatment/Exercise - 01/07/16 0001    Lumbar Exercises: Stretches   Pelvic Tilt 2 reps;30 seconds  Rt hip flexor off the edge of the bed   Pelvic Tilt Limitations butterfly stretch   Modalities   Modalities Electrical Stimulation;Moist Heat   Moist Heat Therapy   Number Minutes Moist Heat 15 Minutes   Moist Heat  Location Hip  Rt anterior and thigh   Electrical Stimulation   Electrical Stimulation Location anterior Rt hip and thigh   Electrical Stimulation Action IFC  and HV with TDN to hip adductors   Electrical Stimulation Parameters to tolerance   Electrical Stimulation Goals Pain;Tone   Manual Therapy   Manual Therapy Soft tissue mobilization   Soft tissue mobilization Rt quad, hip adductors and inside of Rt pelvic girdle.                      PT Long Term Goals - 01/07/16 1239    PT LONG TERM GOAL #1   Title I with HEP ( 01/22/16)    Time 6   Period Weeks   Status On-going   PT LONG TERM GOAL #2   Title report overall pain reduction =/> 50% with daily activity ( 01/22/16)    Time 6   Period Weeks   Status On-going   PT LONG TERM GOAL #3   Title increase Rt LE strength =/> 4+/5 ( 01/22/16)    Time 6   Period Weeks   Status Partially Met   PT LONG TERM GOAL #4  Title report ability to play with her dog per her previous level (01/22/16)    Time 6   Period Weeks   Status On-going   PT LONG TERM GOAL #5   Title demo FOTO =/< 45% limited, CK level (01/22/16)    Time 4   Period Weeks   Status On-going               Plan - 01/07/16 1239    Clinical Impression Statement Tkeya has had good reduction of low back pain, she does continue to have severe Rt groin pain.  She reponded well to trigger point dry needling with manual therapy in her back and this was performed to her Rt iliacus, hip adductors and rectus femoris today.  She reported some reduction of symtptoms at the end of tx.  Zillah is walking in a more upright postion since beginning therapy.    Pt will benefit from skilled therapeutic intervention in order to improve on the following deficits Postural dysfunction;Decreased strength;Pain;Decreased activity tolerance;Increased muscle spasms   Rehab Potential Good   PT Frequency 2x / week   PT Duration 6 weeks   PT Treatment/Interventions  Ultrasound;Traction;Patient/family education;Passive range of motion;Dry needling;Gait training;Cryotherapy;Electrical Stimulation;Functional mobility training;Moist Heat;Therapeutic exercise;Manual techniques;Neuromuscular re-education   PT Next Visit Plan Pt sees MD next week and wishes to discuss therapy with him.  She would benefit from PT as we are making slow progress. Progress has been slower than expected as she is only able to attend sessions once a week as opposed to 2x.    Consulted and Agree with Plan of Care Patient        Problem List Patient Active Problem List   Diagnosis Date Noted  . Syncope 11/20/2011  . Hypertension 11/20/2011  . Atrial fibrillation ([place]) 11/20/2011    Jeral Pinch PT  01/07/2016, 12:43 PM  [facility] [place], [place], [address] Phone: [phone]   Fax:  [phone]  Name: Victoria Francis MRN: [id number] Date of Birth: [birth year]    PHYSICAL THERAPY DISCHARGE SUMMARY  Visits from Start of Care: 4  Current functional level related to goals / functional outcomes: unknown   Remaining deficits: unknown   Education / Equipment: Initial HEP Plan:                                                    Patient goals were partially met. Patient is being discharged due to not returning since the last visit.  ?????    Jeral Pinch, PT 02/19/2016 7:25 AM

## 2016-01-07 NOTE — Patient Instructions (Signed)
Hip Flexor Stretch    Lying on back near edge of bed, bend one leg, foot flat. Hang other leg over edge, relaxed, thigh resting entirely on bed for _10___ slow deep breathes. Repeat _1-2___ times. Do __1-2__ sessions per day. Advanced Exercise: Bend knee back keeping thigh in contact with bed.  http://gt2.exer.us/347   Copyright  VHI. All rights reserved.

## 2016-02-13 ENCOUNTER — Other Ambulatory Visit: Payer: Self-pay | Admitting: Orthopaedic Surgery

## 2016-02-13 DIAGNOSIS — M545 Low back pain: Secondary | ICD-10-CM

## 2016-02-21 ENCOUNTER — Ambulatory Visit
Admission: RE | Admit: 2016-02-21 | Discharge: 2016-02-21 | Disposition: A | Discharge status: Home / Self Care | Payer: Medicare HMO | Source: Ambulatory Visit | Attending: Orthopaedic Surgery | Admitting: Orthopaedic Surgery

## 2016-02-21 DIAGNOSIS — M545 Low back pain: Secondary | ICD-10-CM

## 2016-04-08 ENCOUNTER — Other Ambulatory Visit: Payer: Self-pay

## 2016-04-08 ENCOUNTER — Ambulatory Visit (HOSPITAL_COMMUNITY)
Admission: RE | Admit: 2016-04-08 | Discharge: 2016-04-08 | Disposition: A | Discharge status: Home / Self Care | Payer: Medicare HMO | Source: Ambulatory Visit | Attending: Surgery | Admitting: Surgery

## 2016-04-08 ENCOUNTER — Encounter (HOSPITAL_COMMUNITY): Payer: Self-pay

## 2016-04-08 ENCOUNTER — Encounter (HOSPITAL_COMMUNITY)
Admission: RE | Admit: 2016-04-08 | Discharge: 2016-04-08 | Disposition: A | Discharge status: Home / Self Care | Payer: Medicare HMO | Source: Ambulatory Visit | Attending: Orthopaedic Surgery | Admitting: Orthopaedic Surgery

## 2016-04-08 DIAGNOSIS — Z01810 Encounter for preprocedural cardiovascular examination: Secondary | ICD-10-CM | POA: Diagnosis not present

## 2016-04-08 DIAGNOSIS — Z01818 Encounter for other preprocedural examination: Secondary | ICD-10-CM | POA: Diagnosis not present

## 2016-04-08 DIAGNOSIS — Z01812 Encounter for preprocedural laboratory examination: Secondary | ICD-10-CM | POA: Insufficient documentation

## 2016-04-08 HISTORY — DX: Cerebral infarction, unspecified: I63.9

## 2016-04-08 HISTORY — DX: Anxiety disorder, unspecified: F41.9

## 2016-04-08 HISTORY — DX: Hyperlipidemia, unspecified: E78.5

## 2016-04-08 HISTORY — DX: Effusion, unspecified ankle: M25.473

## 2016-04-08 HISTORY — DX: Unspecified osteoarthritis, unspecified site: M19.90

## 2016-04-08 HISTORY — DX: Gastro-esophageal reflux disease without esophagitis: K21.9

## 2016-04-08 HISTORY — DX: Reserved for inherently not codable concepts without codable children: IMO0001

## 2016-04-08 HISTORY — DX: Unspecified malignant neoplasm of skin, unspecified: C44.90

## 2016-04-08 HISTORY — DX: Restless legs syndrome: G25.81

## 2016-04-08 HISTORY — DX: Urgency of urination: R39.15

## 2016-04-08 LAB — COMPREHENSIVE METABOLIC PANEL
ALK PHOS: 60 U/L (ref 38–126)
ALT: 19 U/L (ref 14–54)
AST: 20 U/L (ref 15–41)
Albumin: 4.1 g/dL (ref 3.5–5.0)
Anion gap: 7 (ref 5–15)
BUN: 18 mg/dL (ref 6–20)
CALCIUM: 9.8 mg/dL (ref 8.9–10.3)
CO2: 26 mmol/L (ref 22–32)
CREATININE: 0.91 mg/dL (ref 0.44–1.00)
Chloride: 103 mmol/L (ref 101–111)
Glucose, Bld: 107 mg/dL — ABNORMAL HIGH (ref 65–99)
Potassium: 3.6 mmol/L (ref 3.5–5.1)
Sodium: 136 mmol/L (ref 135–145)
Total Bilirubin: 0.8 mg/dL (ref 0.3–1.2)
Total Protein: 7 g/dL (ref 6.5–8.1)

## 2016-04-08 LAB — CBC
HCT: 40.8 % (ref 36.0–46.0)
HEMOGLOBIN: 13.4 g/dL (ref 12.0–15.0)
MCH: 31.8 pg (ref 26.0–34.0)
MCHC: 32.8 g/dL (ref 30.0–36.0)
MCV: 96.9 fL (ref 78.0–100.0)
Platelets: 228 10*3/uL (ref 150–400)
RBC: 4.21 MIL/uL (ref 3.87–5.11)
RDW: 12.7 % (ref 11.5–15.5)
WBC: 6.8 10*3/uL (ref 4.0–10.5)

## 2016-04-08 LAB — URINALYSIS, ROUTINE W REFLEX MICROSCOPIC
Bilirubin Urine: NEGATIVE
GLUCOSE, UA: NEGATIVE mg/dL
HGB URINE DIPSTICK: NEGATIVE
Ketones, ur: NEGATIVE mg/dL
LEUKOCYTES UA: NEGATIVE
Nitrite: NEGATIVE
PH: 5.5 (ref 5.0–8.0)
PROTEIN: NEGATIVE mg/dL
Specific Gravity, Urine: 1.02 (ref 1.005–1.030)

## 2016-04-08 LAB — PROTIME-INR
INR: 1.06 (ref 0.00–1.49)
Prothrombin Time: 14 seconds (ref 11.6–15.2)

## 2016-04-08 LAB — SURGICAL PCR SCREEN
MRSA, PCR: NEGATIVE
Staphylococcus aureus: POSITIVE — AB

## 2016-04-08 LAB — APTT: aPTT: 28 seconds (ref 24–37)

## 2016-04-08 NOTE — Progress Notes (Signed)
PCP is Devona Konig PA-C  Cardiologist is Kirk Ruths. Patient denied having had any acute cardiac or pulmonary issues and informed Nurse that she has not seen Dr. Stanford Breed since 2013 because "he released me cause everything was fine." Nurse asked patient about having a history of atrial fibrillation, however patient appeared to be unsure of what that was. Instead patient informed Nurse that she was born with a heart disorder, but has not had any issues with it.  Clearance noted in EPIC from her PCP under care everywhere tab  Will send chart to Anesthesia for review.

## 2016-04-08 NOTE — Pre-Procedure Instructions (Signed)
JAYLAA NODA  04/08/2016     Your procedure is scheduled on : Friday April 17, 2016 at 10:10 AM.  Report to [facility] Admitting at 8:00 AM.  Call this number if you have problems the morning of surgery: [phone]    Remember:  Do not eat food or drink liquids after midnight.  Take these medicines the morning of surgery with A SIP OF WATER : Celexa, Hydrocodone if needed, Metoprolol (Toprol XL), Omeprazole (Prilosec), Vesicare   Stop taking any vitamins,herbal medications/supplements, NSAIDs, Ibuprofen, Advil, Motrin, Aleve, etc on Friday May 26th   Do not wear jewelry, make-up or nail polish.  Do not wear lotions, powders, or perfumes.    Do not shave 48 hours prior to surgery.    Do not bring valuables to the hospital.  [facility] is not responsible for any belongings or valuables.  Contacts, dentures or bridgework may not be worn into surgery.  Leave your suitcase in the car.  After surgery it may be brought to your room.  For patients admitted to the hospital, discharge time will be determined by your treatment team.  Patients discharged the day of surgery will not be allowed to drive home.   Name and phone number of your driver:    Special instructions:  Shower using CHG soap the night before and the morning of your surgery  Please read over the following fact sheets that you were given. Pain Booklet, Coughing and Deep Breathing, MRSA Information and Surgical Site Infection Prevention

## 2016-04-08 NOTE — Progress Notes (Signed)
Nurse called and left a message with patients husband instructing him to tell patient to pick up prescription for  Mupirocin from [facility] in [place] at her earliest convenience. Patients husband stated he would let patient know.

## 2016-04-14 ENCOUNTER — Other Ambulatory Visit: Payer: Self-pay | Admitting: Orthopedic Surgery

## 2016-04-16 MED ORDER — CEFAZOLIN SODIUM-DEXTROSE 2-4 GM/100ML-% IV SOLN
2.0000 g | INTRAVENOUS | Status: AC
  Administered 2016-04-17: 2 g via INTRAVENOUS
  Filled 2016-04-16: qty 100

## 2016-04-16 NOTE — H&P (Signed)
Victoria Francis is an 75 y.o. female.    A 75 year old female returns.  She is having great difficulty standing and walking, cannot go to the store.  [place] and was having considerable problems.  She has more pain in the right than left.  Pain begins when she stands or when she begins to walk.  She can make it about 150 feet.  She does a little bit better in the flexed position.  She is having to ambulate with a cane.  She is concerned when her leg gets weak she may potentially fall.  She has had epidural injections by Dr. Ernestina Patches on 03/10/2016 without relief.  She has been on activity modification, taking anti-inflammatories, and a prednisone pack.  Previous left total knee arthroplasty.  She has had x-rays and intra-articular hip injection which really did not give her any relief.  MRI of her hip showed a small amount of fluid in her hip, mild degenerative changes, and some evidence of partial tearing of the hamstring origin on the left, and her symptoms have mostly been on the right.  She has fallen.  She has bruises on her left ankle.   PAST MEDICAL HISTORY:    PAST SURGICAL HISTORY:  Includes left total knee arthroplasty.  Recurrent rotator cuff tear with repair in 2008, right shoulder.  Two-level cervical fusion without plating and allograft in 2005.  Hysterectomy and appendectomy in the distant past.   MEDICATIONS:  Includes Celexa, Bentyl, hydrochlorothiazide, Norco, and Toprol.  She has been on Percocet in the past for pain.  Klor-Con, Phenergan, and trazodone.   ALLERGIES:  ATIVAN.   SOCIAL HISTORY:  She is here with her son, Victoria Francis.  Patient is a nonsmoker.     Past Medical History  Diagnosis Date  . Atrial fibrillation ([place])   . HTN (hypertension)   . Atrial septal aneurysm   . IBS (irritable bowel syndrome)   . Shortness of breath dyspnea     with exertion  . Ankle swelling     Bilateral swelling; takes Lasix  . Anxiety   . GERD  (gastroesophageal reflux disease)   . Urgency of urination     Takes Vesicare  . Arthritis   . 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 Restless leg syndrome   . Hyperlipemia   . Stroke [facility])     Pt stated "when I get upset my handwriting isnt pretty due to my stoke I had"    Past Surgical History  Procedure Laterality Date  . Tonsillectomy    . Appendectomy    . Total knee arthroplasty Left   . Abdominal hysterectomy    . Foot surgery Right   . Colonoscopy    . Eye surgery Bilateral     Cataracts removed lens implanted    Family History  Problem Relation Age of Onset  . Heart disease Mother     CHF  . Heart disease Brother     CHF  . Coronary artery disease Father     Died of MI at age 33   Social History:  reports that she has never smoked. She has never used smokeless tobacco. She reports that she drinks alcohol. She reports that she does not use illicit drugs.  Allergies:  Allergies  Allergen Reactions  . Iodinated Diagnostic Agents Itching, Shortness Of Breath and Hives  . Ativan [Lorazepam] Other (See Comments)    "Drives me  crazy.Marland KitchenMarland KitchenI tore up their MRI machine"  . Onion Swelling  . Other Hives and Rash    All dyes.  No specificity given in history.    No prescriptions prior to admission    No results found for this or any previous visit (from the past 48 hour(s)). No results found.  Review of Systems  Constitutional: Negative.   HENT: Negative.   Respiratory: Negative.   Cardiovascular: Negative.   Gastrointestinal: Negative.   Genitourinary: Negative.   Musculoskeletal: Positive for back pain.  Skin: Negative.   Psychiatric/Behavioral: Negative.     There were no vitals taken for this visit. Physical Exam  Constitutional: She is oriented to person, place, and time. No distress.  HENT:  Head: Atraumatic.  Eyes: EOM are normal.  Neck: Normal range of motion.  Respiratory: No respiratory distress.  GI: She exhibits no distension.   Neurological: She is alert and oriented to person, place, and time.  Skin: Skin is warm and dry.  Psychiatric: She has a normal mood and affect.     PHYSICAL EXAMINATION:  Patient is alert, oriented.  Memory is fairly good.  Extraocular movements intact.  Height 5 feet 5 inches.  Weight 186.  BP 157/73.  Pulse 68.  No audible wheezing.  Pulse rate is regular.  No abdominal tenderness.  No pain with internal/external rotation of her hips.  Some pain with straight leg raising.  Positive popliteal compression test.  Mild crepitus, right knee.  Well healed left total knee arthroplasty.  Bruises over her left ankle.  Sciatic notch tenderness on the right.  Skin over the lumbar region is normal.  EHL anterior tib is intact.  No hip flexion weakness.  Sensory testing is normal.  She has some mild venous insufficiency, superficial varicosities in her legs.   IMAGING:  MRI on 02/21/2016 shows mild right foraminal bulge at L2-3 on the right which might be symptomatic.  L3-4, a shallow disk with some moderate narrowing of the right foramina on the right.  Left is open.  L4-5 shows severe stenosis with lateral recess narrowing, open foramina, central compression is severe.  L5-S1 looks good.   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  She has progressed to the point where she cannot use the store.  She needs to use a cane to prevent falling.   PLAN:  The plan would be L4-5 decompression.  She understands that some portion of her symptoms might be coming from the right L2-3 and L3-4.  I reviewed the scans with her and also her son, Victoria Francis, who is with her.  She understands that with decompression there is some risk of spinal fluid leakage and dural tear.  We discussed dural repair, risks of infection, blood clots, and all questions were elicited and answered.  She understands and requests we proceed. Lanae Crumbly, PA-C 04/16/2016, 5:54 PM

## 2016-04-17 ENCOUNTER — Encounter (HOSPITAL_COMMUNITY): Payer: Self-pay | Admitting: *Deleted

## 2016-04-17 ENCOUNTER — Observation Stay (HOSPITAL_COMMUNITY)
Admission: RE | Admit: 2016-04-17 | Discharge: 2016-04-18 | Disposition: A | Discharge status: Home / Self Care | Payer: Medicare HMO | Source: Ambulatory Visit | Attending: Orthopaedic Surgery | Admitting: Orthopaedic Surgery

## 2016-04-17 ENCOUNTER — Encounter (HOSPITAL_COMMUNITY)
Admission: RE | Disposition: A | Discharge status: Home / Self Care | Payer: Self-pay | Source: Ambulatory Visit | Attending: Orthopaedic Surgery

## 2016-04-17 ENCOUNTER — Ambulatory Visit (HOSPITAL_COMMUNITY): Payer: Medicare HMO

## 2016-04-17 ENCOUNTER — Ambulatory Visit (HOSPITAL_COMMUNITY): Payer: Medicare HMO | Admitting: Certified Registered"

## 2016-04-17 DIAGNOSIS — R3915 Urgency of urination: Secondary | ICD-10-CM | POA: Insufficient documentation

## 2016-04-17 DIAGNOSIS — G2581 Restless legs syndrome: Secondary | ICD-10-CM | POA: Diagnosis not present

## 2016-04-17 DIAGNOSIS — Z96652 Presence of left artificial knee joint: Secondary | ICD-10-CM | POA: Diagnosis not present

## 2016-04-17 DIAGNOSIS — K219 Gastro-esophageal reflux disease without esophagitis: Secondary | ICD-10-CM | POA: Insufficient documentation

## 2016-04-17 DIAGNOSIS — I10 Essential (primary) hypertension: Secondary | ICD-10-CM | POA: Diagnosis not present

## 2016-04-17 DIAGNOSIS — E785 Hyperlipidemia, unspecified: Secondary | ICD-10-CM | POA: Diagnosis not present

## 2016-04-17 DIAGNOSIS — Z419 Encounter for procedure for purposes other than remedying health state, unspecified: Secondary | ICD-10-CM

## 2016-04-17 DIAGNOSIS — Z79899 Other long term (current) drug therapy: Secondary | ICD-10-CM | POA: Diagnosis not present

## 2016-04-17 DIAGNOSIS — Z8673 Personal history of transient ischemic attack (TIA), and cerebral infarction without residual deficits: Secondary | ICD-10-CM | POA: Diagnosis not present

## 2016-04-17 DIAGNOSIS — M4806 Spinal stenosis, lumbar region: Principal | ICD-10-CM | POA: Insufficient documentation

## 2016-04-17 DIAGNOSIS — Z981 Arthrodesis status: Secondary | ICD-10-CM | POA: Diagnosis not present

## 2016-04-17 DIAGNOSIS — Z9889 Other specified postprocedural states: Secondary | ICD-10-CM

## 2016-04-17 HISTORY — PX: LUMBAR LAMINECTOMY/DECOMPRESSION MICRODISCECTOMY: SHX5026

## 2016-04-17 SURGERY — LUMBAR LAMINECTOMY/DECOMPRESSION MICRODISCECTOMY
Anesthesia: General | Site: Spine Lumbar

## 2016-04-17 MED ORDER — ONDANSETRON HCL 4 MG/2ML IJ SOLN: 4.0000 mg | Freq: Once | INTRAMUSCULAR | Status: DC | PRN

## 2016-04-17 MED ORDER — 0.9 % SODIUM CHLORIDE (POUR BTL) OPTIME
TOPICAL | Status: DC | PRN
  Administered 2016-04-17: 1000 mL

## 2016-04-17 MED ORDER — FENTANYL CITRATE (PF) 250 MCG/5ML IJ SOLN
INTRAMUSCULAR | Status: AC
  Filled 2016-04-17: qty 5

## 2016-04-17 MED ORDER — DEXTROSE 5 % IV SOLN
500.0000 mg | Freq: Four times a day (QID) | INTRAVENOUS | Status: DC | PRN
  Filled 2016-04-17 (×2): qty 5

## 2016-04-17 MED ORDER — LACTATED RINGERS IV SOLN
INTRAVENOUS | Status: DC | PRN
  Administered 2016-04-17 (×2): via INTRAVENOUS

## 2016-04-17 MED ORDER — OMEPRAZOLE MAGNESIUM 20 MG PO TBEC: 20.0000 mg | DELAYED_RELEASE_TABLET | Freq: Every day | ORAL | Status: DC | PRN

## 2016-04-17 MED ORDER — HYDROMORPHONE HCL 1 MG/ML IJ SOLN
INTRAMUSCULAR | Status: AC
  Administered 2016-04-17: 0.5 mg via INTRAVENOUS
  Filled 2016-04-17: qty 1

## 2016-04-17 MED ORDER — LIDOCAINE HCL (CARDIAC) 20 MG/ML IV SOLN
INTRAVENOUS | Status: DC | PRN
  Administered 2016-04-17: 100 mg via INTRAVENOUS

## 2016-04-17 MED ORDER — ONDANSETRON HCL 4 MG/2ML IJ SOLN: 4.0000 mg | INTRAMUSCULAR | Status: DC | PRN

## 2016-04-17 MED ORDER — DEXAMETHASONE SODIUM PHOSPHATE 10 MG/ML IJ SOLN
INTRAMUSCULAR | Status: DC | PRN
  Administered 2016-04-17: 5 mg via INTRAVENOUS

## 2016-04-17 MED ORDER — FENTANYL CITRATE (PF) 100 MCG/2ML IJ SOLN
INTRAMUSCULAR | Status: DC | PRN
  Administered 2016-04-17: 25 ug via INTRAVENOUS
  Administered 2016-04-17: 50 ug via INTRAVENOUS
  Administered 2016-04-17: 100 ug via INTRAVENOUS
  Administered 2016-04-17: 25 ug via INTRAVENOUS

## 2016-04-17 MED ORDER — ROCURONIUM BROMIDE 50 MG/5ML IV SOLN
INTRAVENOUS | Status: AC
  Filled 2016-04-17: qty 1

## 2016-04-17 MED ORDER — HYDROCHLOROTHIAZIDE 25 MG PO TABS
25.0000 mg | ORAL_TABLET | Freq: Every day | ORAL | Status: DC
  Administered 2016-04-17 – 2016-04-18 (×2): 25 mg via ORAL
  Filled 2016-04-17 (×2): qty 1

## 2016-04-17 MED ORDER — HYDROCODONE-ACETAMINOPHEN 7.5-325 MG PO TABS: 1.0000 | ORAL_TABLET | Freq: Once | ORAL | Status: DC | PRN

## 2016-04-17 MED ORDER — OXYCODONE-ACETAMINOPHEN 5-325 MG PO TABS
1.0000 | ORAL_TABLET | Freq: Four times a day (QID) | ORAL | Status: DC | PRN
  Administered 2016-04-17 – 2016-04-18 (×3): 1 via ORAL
  Filled 2016-04-17 (×3): qty 1

## 2016-04-17 MED ORDER — ONDANSETRON HCL 4 MG/2ML IJ SOLN
INTRAMUSCULAR | Status: DC | PRN
  Administered 2016-04-17: 4 mg via INTRAVENOUS

## 2016-04-17 MED ORDER — OXYCODONE-ACETAMINOPHEN 5-325 MG PO TABS: 1.0000 | ORAL_TABLET | Freq: Four times a day (QID) | ORAL | Status: AC | PRN

## 2016-04-17 MED ORDER — SUGAMMADEX SODIUM 200 MG/2ML IV SOLN
INTRAVENOUS | Status: AC
  Filled 2016-04-17: qty 2

## 2016-04-17 MED ORDER — MENTHOL 3 MG MT LOZG
1.0000 | LOZENGE | OROMUCOSAL | Status: DC | PRN
Start: 2016-04-17 — End: 2016-04-18

## 2016-04-17 MED ORDER — ROPINIROLE HCL 1 MG PO TABS
1.0000 mg | ORAL_TABLET | Freq: Every day | ORAL | Status: DC
  Administered 2016-04-17: 1 mg via ORAL
  Filled 2016-04-17: qty 1

## 2016-04-17 MED ORDER — DOCUSATE SODIUM 100 MG PO CAPS
100.0000 mg | ORAL_CAPSULE | Freq: Two times a day (BID) | ORAL | Status: DC
  Administered 2016-04-17 – 2016-04-18 (×3): 100 mg via ORAL
  Filled 2016-04-17 (×3): qty 1

## 2016-04-17 MED ORDER — ACETAMINOPHEN 325 MG PO TABS: 650.0000 mg | ORAL_TABLET | ORAL | Status: DC | PRN

## 2016-04-17 MED ORDER — ROCURONIUM BROMIDE 100 MG/10ML IV SOLN
INTRAVENOUS | Status: DC | PRN
  Administered 2016-04-17: 50 mg via INTRAVENOUS

## 2016-04-17 MED ORDER — PHENOL 1.4 % MT LIQD
1.0000 | OROMUCOSAL | Status: DC | PRN
Start: 2016-04-17 — End: 2016-04-18

## 2016-04-17 MED ORDER — BUPIVACAINE HCL (PF) 0.25 % IJ SOLN
INTRAMUSCULAR | Status: AC
  Filled 2016-04-17: qty 30

## 2016-04-17 MED ORDER — ACETAMINOPHEN 650 MG RE SUPP: 650.0000 mg | RECTAL | Status: DC | PRN

## 2016-04-17 MED ORDER — CHLORHEXIDINE GLUCONATE 4 % EX LIQD: 60.0000 mL | Freq: Once | CUTANEOUS | Status: DC

## 2016-04-17 MED ORDER — BUPIVACAINE HCL (PF) 0.25 % IJ SOLN
INTRAMUSCULAR | Status: DC | PRN
  Administered 2016-04-17: 10 mL

## 2016-04-17 MED ORDER — PHENYLEPHRINE 40 MCG/ML (10ML) SYRINGE FOR IV PUSH (FOR BLOOD PRESSURE SUPPORT)
PREFILLED_SYRINGE | INTRAVENOUS | Status: AC
  Filled 2016-04-17: qty 20

## 2016-04-17 MED ORDER — SODIUM CHLORIDE 0.45 % IV SOLN: INTRAVENOUS | Status: DC

## 2016-04-17 MED ORDER — METHOCARBAMOL 500 MG PO TABS
500.0000 mg | ORAL_TABLET | Freq: Four times a day (QID) | ORAL | Status: DC | PRN
  Administered 2016-04-17 – 2016-04-18 (×2): 500 mg via ORAL
  Filled 2016-04-17 (×3): qty 1

## 2016-04-17 MED ORDER — PANTOPRAZOLE SODIUM 40 MG PO TBEC
40.0000 mg | DELAYED_RELEASE_TABLET | Freq: Every day | ORAL | Status: DC
  Filled 2016-04-17: qty 1

## 2016-04-17 MED ORDER — SODIUM CHLORIDE 0.9% FLUSH
3.0000 mL | Freq: Two times a day (BID) | INTRAVENOUS | Status: DC
  Administered 2016-04-17 – 2016-04-18 (×3): 3 mL via INTRAVENOUS

## 2016-04-17 MED ORDER — CITALOPRAM HYDROBROMIDE 10 MG PO TABS: 10.0000 mg | ORAL_TABLET | Freq: Every day | ORAL | Status: DC

## 2016-04-17 MED ORDER — PROPOFOL 10 MG/ML IV BOLUS
INTRAVENOUS | Status: DC | PRN
  Administered 2016-04-17: 120 mg via INTRAVENOUS
  Administered 2016-04-17: 80 mg via INTRAVENOUS

## 2016-04-17 MED ORDER — METOPROLOL SUCCINATE ER 25 MG PO TB24
25.0000 mg | ORAL_TABLET | Freq: Every day | ORAL | Status: DC
  Filled 2016-04-17: qty 1

## 2016-04-17 MED ORDER — ATORVASTATIN CALCIUM 40 MG PO TABS
40.0000 mg | ORAL_TABLET | Freq: Every day | ORAL | Status: DC
  Administered 2016-04-17: 40 mg via ORAL
  Filled 2016-04-17: qty 1

## 2016-04-17 MED ORDER — PHENYLEPHRINE HCL 10 MG/ML IJ SOLN
INTRAMUSCULAR | Status: DC | PRN
  Administered 2016-04-17: 80 ug via INTRAVENOUS
  Administered 2016-04-17: 40 ug via INTRAVENOUS
  Administered 2016-04-17: 80 ug via INTRAVENOUS
  Administered 2016-04-17 (×2): 40 ug via INTRAVENOUS
  Administered 2016-04-17: 80 ug via INTRAVENOUS
  Administered 2016-04-17: 40 ug via INTRAVENOUS
  Administered 2016-04-17: 80 ug via INTRAVENOUS

## 2016-04-17 MED ORDER — SODIUM CHLORIDE 0.9% FLUSH: 3.0000 mL | INTRAVENOUS | Status: DC | PRN

## 2016-04-17 MED ORDER — POLYETHYLENE GLYCOL 3350 17 G PO PACK: 17.0000 g | PACK | Freq: Every day | ORAL | Status: DC | PRN

## 2016-04-17 MED ORDER — HYDROMORPHONE HCL 1 MG/ML IJ SOLN
0.2500 mg | INTRAMUSCULAR | Status: DC | PRN
  Administered 2016-04-17 (×2): 0.5 mg via INTRAVENOUS

## 2016-04-17 MED ORDER — PROPOFOL 10 MG/ML IV BOLUS
INTRAVENOUS | Status: AC
  Filled 2016-04-17: qty 20

## 2016-04-17 MED ORDER — LIDOCAINE 2% (20 MG/ML) 5 ML SYRINGE
INTRAMUSCULAR | Status: AC
  Filled 2016-04-17: qty 5

## 2016-04-17 MED ORDER — CEFAZOLIN SODIUM 1-5 GM-% IV SOLN
1.0000 g | Freq: Three times a day (TID) | INTRAVENOUS | Status: AC
  Administered 2016-04-17 (×2): 1 g via INTRAVENOUS
  Filled 2016-04-17 (×2): qty 50

## 2016-04-17 MED ORDER — TRAZODONE HCL 50 MG PO TABS
25.0000 mg | ORAL_TABLET | Freq: Every day | ORAL | Status: DC
  Administered 2016-04-17: 50 mg via ORAL
  Filled 2016-04-17: qty 1

## 2016-04-17 MED ORDER — POTASSIUM CHLORIDE CRYS ER 20 MEQ PO TBCR
20.0000 meq | EXTENDED_RELEASE_TABLET | Freq: Every day | ORAL | Status: DC
  Administered 2016-04-17 – 2016-04-18 (×2): 20 meq via ORAL
  Filled 2016-04-17 (×2): qty 1

## 2016-04-17 MED ORDER — SUGAMMADEX SODIUM 200 MG/2ML IV SOLN
INTRAVENOUS | Status: DC | PRN
  Administered 2016-04-17: 200 mg via INTRAVENOUS

## 2016-04-17 MED ORDER — PHENYLEPHRINE 40 MCG/ML (10ML) SYRINGE FOR IV PUSH (FOR BLOOD PRESSURE SUPPORT)
PREFILLED_SYRINGE | INTRAVENOUS | Status: AC
  Filled 2016-04-17: qty 10

## 2016-04-17 MED ORDER — HYDROMORPHONE HCL 1 MG/ML IJ SOLN
0.5000 mg | INTRAMUSCULAR | Status: DC | PRN
Start: 2016-04-17 — End: 2016-04-18
  Administered 2016-04-17: 0.5 mg via INTRAVENOUS
  Filled 2016-04-17: qty 1

## 2016-04-17 MED ORDER — METHOCARBAMOL 500 MG PO TABS: 500.0000 mg | ORAL_TABLET | Freq: Four times a day (QID) | ORAL | Status: AC | PRN

## 2016-04-17 MED ORDER — SODIUM CHLORIDE 0.9 % IV SOLN: 250.0000 mL | INTRAVENOUS | Status: DC

## 2016-04-17 MED ORDER — DARIFENACIN HYDROBROMIDE ER 7.5 MG PO TB24
7.5000 mg | ORAL_TABLET | Freq: Every day | ORAL | Status: DC
  Administered 2016-04-18: 7.5 mg via ORAL
  Filled 2016-04-17: qty 1

## 2016-04-17 SURGICAL SUPPLY — 51 items
ADH SKN CLS APL DERMABOND .7 (GAUZE/BANDAGES/DRESSINGS)
APL SKNCLS STERI-STRIP NONHPOA (GAUZE/BANDAGES/DRESSINGS) ×1
BENZOIN TINCTURE PRP APPL 2/3 (GAUZE/BANDAGES/DRESSINGS) ×2 IMPLANT
BUR ROUND FLUTED 4 SOFT TCH (BURR) ×1 IMPLANT
BUR ROUND FLUTED 4MM SOFT TCH (BURR) ×1
CANISTER SUCT 3000ML PPV (MISCELLANEOUS) ×3 IMPLANT
CLOSURE STERI-STRIP 1/2X4 (GAUZE/BANDAGES/DRESSINGS) ×1
CLSR STERI-STRIP ANTIMIC 1/2X4 (GAUZE/BANDAGES/DRESSINGS) ×2 IMPLANT
COVER SURGICAL LIGHT HANDLE (MISCELLANEOUS) ×3 IMPLANT
DECANTER SPIKE VIAL GLASS SM (MISCELLANEOUS) ×3 IMPLANT
DERMABOND ADVANCED (GAUZE/BANDAGES/DRESSINGS)
DERMABOND ADVANCED .7 DNX12 (GAUZE/BANDAGES/DRESSINGS) ×1 IMPLANT
DRAPE MICROSCOPE LEICA (MISCELLANEOUS) ×3 IMPLANT
DRAPE PROXIMA HALF (DRAPES) ×6 IMPLANT
DRAPE SURG 17X23 STRL (DRAPES) ×3 IMPLANT
DRSG MEPILEX BORDER 4X4 (GAUZE/BANDAGES/DRESSINGS) ×1 IMPLANT
DRSG MEPILEX BORDER 4X8 (GAUZE/BANDAGES/DRESSINGS) ×2 IMPLANT
DURAPREP 26ML APPLICATOR (WOUND CARE) ×3 IMPLANT
ELECT BLADE 4.0 EZ CLEAN MEGAD (MISCELLANEOUS) ×3
ELECT REM PT RETURN 9FT ADLT (ELECTROSURGICAL) ×3
ELECTRODE BLDE 4.0 EZ CLN MEGD (MISCELLANEOUS) IMPLANT
ELECTRODE REM PT RTRN 9FT ADLT (ELECTROSURGICAL) ×1 IMPLANT
GLOVE BIOGEL PI IND STRL 8 (GLOVE) ×2 IMPLANT
GLOVE BIOGEL PI INDICATOR 8 (GLOVE) ×4
GLOVE ORTHO TXT STRL SZ7.5 (GLOVE) ×6 IMPLANT
GOWN STRL REUS W/ TWL LRG LVL3 (GOWN DISPOSABLE) ×2 IMPLANT
GOWN STRL REUS W/ TWL XL LVL3 (GOWN DISPOSABLE) ×1 IMPLANT
GOWN STRL REUS W/TWL 2XL LVL3 (GOWN DISPOSABLE) ×3 IMPLANT
GOWN STRL REUS W/TWL LRG LVL3 (GOWN DISPOSABLE) ×6
GOWN STRL REUS W/TWL XL LVL3 (GOWN DISPOSABLE) ×3
KIT BASIN OR (CUSTOM PROCEDURE TRAY) ×3 IMPLANT
KIT ROOM TURNOVER OR (KITS) ×3 IMPLANT
MANIFOLD NEPTUNE II (INSTRUMENTS) ×3 IMPLANT
NDL HYPO 25GX1X1/2 BEV (NEEDLE) ×1 IMPLANT
NDL SPNL 18GX3.5 QUINCKE PK (NEEDLE) ×1 IMPLANT
NEEDLE HYPO 25GX1X1/2 BEV (NEEDLE) ×3 IMPLANT
NEEDLE SPNL 18GX3.5 QUINCKE PK (NEEDLE) ×3 IMPLANT
NS IRRIG 1000ML POUR BTL (IV SOLUTION) ×3 IMPLANT
PACK LAMINECTOMY ORTHO (CUSTOM PROCEDURE TRAY) ×3 IMPLANT
PAD ARMBOARD 7.5X6 YLW CONV (MISCELLANEOUS) ×6 IMPLANT
PATTIES SURGICAL .5 X.5 (GAUZE/BANDAGES/DRESSINGS) IMPLANT
PATTIES SURGICAL .75X.75 (GAUZE/BANDAGES/DRESSINGS) IMPLANT
SUT VIC AB 0 CT1 27 (SUTURE) ×3
SUT VIC AB 0 CT1 27XBRD ANBCTR (SUTURE) ×1 IMPLANT
SUT VIC AB 0 CTX 36 (SUTURE) ×3
SUT VIC AB 0 CTX36XBRD ANTBCTR (SUTURE) IMPLANT
SUT VIC AB 2-0 CT1 27 (SUTURE) ×3
SUT VIC AB 2-0 CT1 TAPERPNT 27 (SUTURE) ×1 IMPLANT
SUT VIC AB 3-0 X1 27 (SUTURE) IMPLANT
TOWEL OR 17X24 6PK STRL BLUE (TOWEL DISPOSABLE) ×3 IMPLANT
TOWEL OR 17X26 10 PK STRL BLUE (TOWEL DISPOSABLE) ×3 IMPLANT

## 2016-04-17 NOTE — Progress Notes (Signed)
Positive results for Staph from PCR screen pt has used Mupirocin times 6 doses, but due to only uses 6, Betadine used.

## 2016-04-17 NOTE — Brief Op Note (Signed)
04/17/2016  9:15 AM  PATIENT:  [name]  75 y.o. female  PRE-OPERATIVE DIAGNOSIS:  L4-5 Stenosis  POST-OPERATIVE DIAGNOSIS:  L4-5 Stenosis  PROCEDURE:  Procedure(s): L4-5 Decompression (N/A)  SURGEON:  Surgeon(s) and Role:    * Marybelle Killings, MD - Primary  PHYSICIAN ASSISTANT: Zamyiah Tino m. Emmaline Wahba pa-c    ANESTHESIA:   general  EBL:  Total I/O In: 1000 [I.V.:1000] Out: -   BLOOD ADMINISTERED:none  DRAINS: none   LOCAL MEDICATIONS USED:  MARCAINE     SPECIMEN:  No Specimen  DISPOSITION OF SPECIMEN:  N/A  COUNTS:  YES  TOURNIQUET:  * No tourniquets in log *  DICTATION: .Dragon Dictation  PLAN OF CARE: Admit for overnight observation  PATIENT DISPOSITION:  PACU - hemodynamically stable.

## 2016-04-17 NOTE — Interval H&P Note (Signed)
History and Physical Interval Note:  04/17/2016 7:24 AM  [name]  has presented today for surgery, with the diagnosis of L4-5 Stenosis  The various methods of treatment have been discussed with the patient and family. After consideration of risks, benefits and other options for treatment, the patient has consented to  Procedure(s): L4-5 Decompression (N/A) as a surgical intervention .  The patient's history has been reviewed, patient examined, no change in status, stable for surgery.  I have reviewed the patient's chart and labs.  Questions were answered to the patient's satisfaction.     Victoria Francis C

## 2016-04-17 NOTE — Anesthesia Procedure Notes (Signed)
Procedure Name: Intubation Date/Time: 04/17/2016 8:23 AM Performed by: Rivka Breech Pre-anesthesia Checklist: Patient identified, Emergency Drugs available, Suction available, Patient being monitored and Timeout performed Patient Re-evaluated:Patient Re-evaluated prior to inductionOxygen Delivery Method: Circle system utilized Preoxygenation: Pre-oxygenation with 100% oxygen Intubation Type: IV induction Ventilation: Mask ventilation without difficulty Laryngoscope Size: Mac and 3 Grade View: Grade I Number of attempts: 1 Airway Equipment and Method: Stylet Placement Confirmation: ETT inserted through vocal cords under direct vision,  positive ETCO2,  CO2 detector and breath sounds checked- equal and bilateral Secured at: 22 cm Tube secured with: Tape Dental Injury: Teeth and Oropharynx as per pre-operative assessment

## 2016-04-17 NOTE — Interval H&P Note (Signed)
History and Physical Interval Note:  04/17/2016 7:24 AM  [name]  has presented today for surgery, with the diagnosis of L4-5 Stenosis  The various methods of treatment have been discussed with the patient and family. After consideration of risks, benefits and other options for treatment, the patient has consented to  Procedure(s): L4-5 Decompression (N/A) as a surgical intervention .  The patient's history has been reviewed, patient examined, no change in status, stable for surgery.  I have reviewed the patient's chart and labs.  Questions were answered to the patient's satisfaction.     Victoria Francis

## 2016-04-17 NOTE — Progress Notes (Signed)
Utilization review completed.  

## 2016-04-17 NOTE — Transfer of Care (Signed)
Immediate Anesthesia Transfer of Care Note  Patient: Victoria Francis  Procedure(s) Performed: Procedure(s): L4-5 Decompression (N/A)  Patient Location: PACU  Anesthesia Type:General  Level of Consciousness:  sedated, patient cooperative and responds to stimulation  Airway & Oxygen Therapy:Patient Spontanous Breathing and Patient connected to face mask oxgen  Post-op Assessment:  Report given to PACU RN and Post -op Vital signs reviewed and stable  Post vital signs:  Reviewed and stable  Last Vitals:  Filed Vitals:   04/17/16 0605  BP: 153/64  Pulse: 73  Temp: 36.4 C  Resp: 20    Complications: No apparent anesthesia complications

## 2016-04-17 NOTE — Anesthesia Postprocedure Evaluation (Signed)
Anesthesia Post Note  Patient: Victoria Francis  Procedure(s) Performed: Procedure(s) (LRB): L4-5 Decompression (N/A)  Patient location during evaluation: PACU Anesthesia Type: General Level of consciousness: awake and alert Pain management: pain level controlled Vital Signs Assessment: post-procedure vital signs reviewed and stable Respiratory status: spontaneous breathing, nonlabored ventilation, respiratory function stable and patient connected to nasal cannula oxygen Cardiovascular status: blood pressure returned to baseline and stable Postop Assessment: no signs of nausea or vomiting Anesthetic complications: no    Last Vitals:  Filed Vitals:   04/17/16 1030 04/17/16 1058  BP: 122/69 125/65  Pulse:  72  Temp:  36.7 C  Resp: 11     Last Pain:  Filed Vitals:   04/17/16 1102  PainSc: 2                  Zenaida Deed

## 2016-04-17 NOTE — Plan of Care (Signed)
Problem: Pain Managment: Goal: General experience of comfort will improve Outcome: Progressing Pain levels under control with management medications.

## 2016-04-17 NOTE — Anesthesia Preprocedure Evaluation (Addendum)
Anesthesia Evaluation  Patient identified by MRN, date of birth, ID band Patient awake    Reviewed: Allergy & Precautions, H&P , NPO status , Patient's Chart, lab work & pertinent test results  History of Anesthesia Complications Negative for: history of anesthetic complications  Airway Mallampati: II  TM Distance: >3 FB Neck ROM: full    Dental no notable dental hx.    Pulmonary shortness of breath,    Pulmonary exam normal breath sounds clear to auscultation       Cardiovascular hypertension, Normal cardiovascular exam+ dysrhythmias Atrial Fibrillation  Rhythm:regular Rate:Normal     Neuro/Psych Anxiety CVA, Residual Symptoms    GI/Hepatic negative GI ROS, Neg liver ROS, GERD  ,  Endo/Other  negative endocrine ROS  Renal/GU negative Renal ROS     Musculoskeletal  (+) Arthritis ,   Abdominal   Peds  Hematology negative hematology ROS (+)   Anesthesia Other Findings   Reproductive/Obstetrics negative OB ROS                            Anesthesia Physical Anesthesia Plan  ASA: III  Anesthesia Plan: General   Post-op Pain Management:    Induction: Intravenous  Airway Management Planned: Oral ETT  Additional Equipment:   Intra-op Plan:   Post-operative Plan:   Informed Consent: I have reviewed the patients History and Physical, chart, labs and discussed the procedure including the risks, benefits and alternatives for the proposed anesthesia with the patient or authorized representative who has indicated his/her understanding and acceptance.   Dental Advisory Given  Plan Discussed with: Anesthesiologist, CRNA and Surgeon  Anesthesia Plan Comments: (Would avoid midazolam given ativan allergy)       Anesthesia Quick Evaluation

## 2016-04-18 DIAGNOSIS — M4806 Spinal stenosis, lumbar region: Secondary | ICD-10-CM | POA: Diagnosis not present

## 2016-04-18 NOTE — Progress Notes (Signed)
Patient ID: Victoria Francis, female   DOB: Jan 11, 1941, 75 y.o.   MRN: [id number] Patient is status post lumbar laminectomy and decompression. He has progressed well and plan for discharge to home today in stable condition follow up with Dr. Lorin Mercy in 2 weeks prescriptions provided.

## 2016-04-18 NOTE — Care Management Obs Status (Signed)
[facility] NOTIFICATION   Patient Details  Name: Victoria Francis MRN: [id number] Date of Birth: 1940/12/29   Medicare Observation Status Notification Given:  Yes    Ninfa Meeker, RN 04/18/2016, 9:54 AM

## 2016-04-18 NOTE — Progress Notes (Signed)
Reviewed discharge instructions/medications with patient and patient's son.  Answered all questions.  Ilene Qua 10:59 AM 04/18/2016

## 2016-04-20 ENCOUNTER — Encounter (HOSPITAL_COMMUNITY): Payer: Self-pay | Admitting: Orthopaedic Surgery

## 2016-04-21 NOTE — Op Note (Signed)
Victoria Francis, Victoria Francis NO.:  [id number]  MEDICAL RECORD NO.:  [id number]  LOCATION:  [id number]                        FACILITY:  [facility]  PHYSICIAN:  Shanielle Correll C. Lorin Mercy, M.D.    DATE OF BIRTH:  1941/07/10  DATE OF PROCEDURE:  04/17/2016 DATE OF DISCHARGE:  04/18/2016                              OPERATIVE REPORT   PREOPERATIVE DIAGNOSIS:  L4-5 stenosis.  POSTOPERATIVE DIAGNOSIS:  L4-5 stenosis.  PROCEDURE:  L4-5 decompression.  SURGEON:  Ronesha Heenan C. Lorin Mercy, M.D.  ASSISTANT:  Alyson Locket. Ricard Dillon, PA-C. Medically necessary and present for the entire procedure.   ANESTHESIA:  General.  ESTIMATED BLOOD LOSS:  Less than 100 mL.  DRAINS:  None.  COMPLICATIONS:  None.  BRIEF HISTORY:  This 75 year old female has progressive problems with neurogenic claudication, spinal stenosis symptoms improved using a grocery cart, relieved with sitting, increased pain with standing and walking.  MRI scan showed severe central canal stenosis with ligamentum thickening, broad-based central disk bulge and left worse than right lateral recess stenosis.  Foramina were open.  The patient had progression of spondylosis from previous imaging years ago.  DESCRIPTION OF PROCEDURE:  After standard prepping and draping after intubation, the patient in the prone position on chest rolls.  DuraPrep was used, preoperative antibiotics.  Area was squared with towels, Betadine, Steri-Drape, laminectomy sheets and drapes, time-out procedure.  Midline incision was made after needle localization with L4- 5 level identified with spinal needle.  Once subperiosteal dissection out on the lamina was performed, self-retaining retractors and Kocher clamps were placed above and below, levels for planned decompression. Repeat x-ray was taken.  Spinous process was resected at L4, top portion of L5, lamina was thinned with the bur and resected with the [place], patties were used to protect the dura.  There was  ligamentum hypertrophy, thick chunks with infolding, which were resected and the lateral recess was decompressed on both sides with use of the operative microscope protecting the dura, removing overhanging spurs near the canal, lateral recess and primarily ligamentum thickening.  Both gutters were smoothed, nerve root was checked as it exited out from the lateral recess into the foramina and no areas of compression.  Disk bulge was firm and microdiskectomy was not performed, careful irrigation, checking both gutters, dura was intact.  Operative field was dry.  Epidural bleeding was none-to-minimal. Self-retaining retractor was removed.  Operative microscope was removed and then standard layered closure, closure of the fascia, interrupted sutures of 2-0 Vicryl and subcutaneous tissue, subcuticular closure, Dermabond on the skin, postop dressing.  She tolerated the procedure well, transferred to the recovery room in stable condition.     Jonus Coble C. Lorin Mercy, M.D.     MCY/MEDQ  D:  04/20/2016  T:  04/21/2016  Job:  [id number]

## 2016-05-20 NOTE — Discharge Summary (Signed)
Patient ID: Victoria Francis MRN: [id number] DOB/AGE: 1941-03-13 75 y.o.  Admit date: 04/17/2016 Discharge date: 05/20/2016  Admission Diagnoses:  Active Problems:   History of lumbar laminectomy for spinal cord decompression   Discharge Diagnoses:  Active Problems:   History of lumbar laminectomy for spinal cord decompression  status post Procedure(s): L4-5 Decompression  Past Medical History  Diagnosis Date  . Atrial fibrillation ([place])   . HTN (hypertension)   . Atrial septal aneurysm   . IBS (irritable bowel syndrome)   . Shortness of breath dyspnea     with exertion  . Ankle swelling     Bilateral swelling; takes Lasix  . Anxiety   . GERD (gastroesophageal reflux disease)   . Urgency of urination     Takes Vesicare  . Arthritis   . 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 Restless leg syndrome   . Hyperlipemia   . Stroke [facility])     Pt stated "when I get upset my handwriting isnt pretty due to my stoke I had"    Surgeries: Procedure(s): L4-5 Decompression on 04/17/2016   Consultants:    Discharged Condition: Improved  Hospital Course: Victoria Francis is an 75 y.o. female who was admitted 04/17/2016 for operative treatment of lumbar stenosis.  Patient failed conservative treatments (please see the history and physical for the specifics) and had severe unremitting pain that affects sleep, daily activities and work/hobbies. After pre-op clearance, the patient was taken to the operating room on 04/17/2016 and underwent  Procedure(s): L4-5 Decompression.    Patient was given perioperative antibiotics:  Anti-infectives    Start     Dose/Rate Route Frequency Ordered Stop   04/17/16 1600  ceFAZolin (ANCEF) IVPB 1 g/50 mL premix     1 g 100 mL/hr over 30 Minutes Intravenous Every 8 hours 04/17/16 1059 04/17/16 2150   04/17/16 0700  ceFAZolin (ANCEF) IVPB 2g/100 mL premix     2 g 200 mL/hr over 30 Minutes Intravenous To [facility] 04/16/16 1517 04/17/16  0757       Patient was given sequential compression devices and early ambulation to prevent DVT.   Patient benefited maximally from hospital stay and there were no complications. At the time of discharge, the patient was urinating/moving their bowels without difficulty, tolerating a regular diet, pain is controlled with oral pain medications and they have been cleared by PT/OT.   Recent vital signs: No data found.    Recent laboratory studies: No results for input(s): WBC, HGB, HCT, PLT, NA, K, CL, CO2, BUN, CREATININE, GLUCOSE, INR, CALCIUM in the last 72 hours.  Invalid input(s): PT, 2   Discharge Medications:     Medication List    STOP taking these medications        HYDROcodone-acetaminophen 5-500 MG tablet  Commonly known as:  VICODIN     vitamin E 100 UNIT capsule      TAKE these medications        atorvastatin 40 MG tablet  Commonly known as:  LIPITOR  Take 40 mg by mouth daily.     CELEXA 20 MG tablet  Generic drug:  citalopram  Take 10 mg by mouth daily.     cholecalciferol 1000 units tablet  Commonly known as:  VITAMIN D  Take 1,000 Units by mouth daily.     hydrochlorothiazide 50 MG tablet  Commonly known as:  HYDRODIURIL  Take 25 mg by mouth daily. Take extra 25  mg tablet when needed     KLOR-CON M20 20 MEQ tablet  Generic drug:  potassium chloride SA  Take 20 mEq by mouth daily.     Magnesium 100 MG Caps  Take 1 capsule by mouth daily. Reported on 12/11/2015     methocarbamol 500 MG tablet  Commonly known as:  ROBAXIN  Take 1 tablet (500 mg total) by mouth every 6 (six) hours as needed for muscle spasms.     metoprolol succinate 25 MG 24 hr tablet  Commonly known as:  TOPROL-XL  Take 25 mg by mouth daily.     omeprazole 20 MG tablet  Commonly known as:  PRILOSEC OTC  Take 20 mg by mouth daily as needed (heartburn).     oxyCODONE-acetaminophen 5-325 MG tablet  Commonly known as:  PERCOCET/ROXICET  Take 1 tablet by mouth every 6 (six)  hours as needed for moderate pain.     rOPINIRole 1 MG tablet  Commonly known as:  REQUIP  Take 1 mg by mouth daily.     traZODone 50 MG tablet  Commonly known as:  DESYREL  Take 25-50 mg by mouth at bedtime.     VESICARE 5 MG tablet  Generic drug:  solifenacin  Take 10 mg by mouth daily.        Diagnostic Studies: No results found.      Discharge Instructions    Call MD / Call 911    Complete by:  As directed   If you experience chest pain or shortness of breath, CALL 911 and be transported to the hospital emergency room.  If you develope a fever above 101 F, pus (white drainage) or increased drainage or redness at the wound, or calf pain, call your surgeon's office.     Call MD / Call 911    Complete by:  As directed   If you experience chest pain or shortness of breath, CALL 911 and be transported to the hospital emergency room.  If you develope a fever above 101 F, pus (white drainage) or increased drainage or redness at the wound, or calf pain, call your surgeon's office.     Constipation Prevention    Complete by:  As directed   Drink plenty of fluids.  Prune juice may be helpful.  You may use a stool softener, such as Colace (over the counter) 100 mg twice a day.  Use MiraLax (over the counter) for constipation as needed.     Constipation Prevention    Complete by:  As directed   Drink plenty of fluids.  Prune juice may be helpful.  You may use a stool softener, such as Colace (over the counter) 100 mg twice a day.  Use MiraLax (over the counter) for constipation as needed.     Diet - low sodium heart healthy    Complete by:  As directed      Diet - low sodium heart healthy    Complete by:  As directed      Discharge instructions    Complete by:  As directed   Ok to shower 5 days postop.  Do not apply any creams or ointments to incision.  Do not remove steri-strips.  Can use 4x4 gauze and tape for dressing changes.  No aggressive activity.  No bending, squatting or  prolonged sitting.  Mostly be in reclined position or lying down.     Driving restrictions    Complete by:  As directed   No driving until  further notice.     Increase activity slowly as tolerated    Complete by:  As directed      Increase activity slowly as tolerated    Complete by:  As directed      Lifting restrictions    Complete by:  As directed   No lifting until further notice.           Follow-up Information    Schedule an appointment as soon as possible for a visit with Marybelle Killings, MD.   Specialty:  Orthopedic Surgery   Why:  NEED RETURN OFFICE VISIT ONE WEEK POSTOP   Contact information:   [address] [place] [address] [phone]       Discharge Plan:  discharge to home  Disposition:     Signed: Lanae Crumbly \  05/20/2016, 9:16 AM

## 2016-06-11 ENCOUNTER — Other Ambulatory Visit: Payer: Self-pay | Admitting: Orthopaedic Surgery

## 2016-06-11 DIAGNOSIS — M25561 Pain in right knee: Secondary | ICD-10-CM

## 2016-06-21 ENCOUNTER — Ambulatory Visit
Admission: RE | Admit: 2016-06-21 | Discharge: 2016-06-21 | Disposition: A | Discharge status: Home / Self Care | Payer: Medicare HMO | Source: Ambulatory Visit | Attending: Orthopaedic Surgery | Admitting: Orthopaedic Surgery

## 2016-06-21 DIAGNOSIS — M25561 Pain in right knee: Secondary | ICD-10-CM

## 2016-09-15 ENCOUNTER — Telehealth (INDEPENDENT_AMBULATORY_CARE_PROVIDER_SITE_OTHER): Payer: Self-pay | Admitting: Orthopaedic Surgery

## 2016-09-15 NOTE — Telephone Encounter (Signed)
Please advise. Patient last had Norco 7.5/325 1 po q 4-6 hrs prn #40 on 08/07/2016.

## 2016-09-16 NOTE — Telephone Encounter (Signed)
Print norco 5/325 # 15 tablets.   1 po daily PRN pain. Let her know this is the last Rx. thanks

## 2016-09-17 MED ORDER — HYDROCODONE-ACETAMINOPHEN 5-325 MG PO TABS: 1.0000 | ORAL_TABLET | Freq: Every day | ORAL | 0 refills | Status: AC

## 2016-09-17 NOTE — Addendum Note (Signed)
Addended by: Meyer Cory on: 09/17/2016 01:32 PM   Modules accepted: Orders

## 2016-10-06 ENCOUNTER — Ambulatory Visit (INDEPENDENT_AMBULATORY_CARE_PROVIDER_SITE_OTHER): Payer: Self-pay | Admitting: Orthopaedic Surgery

## 2016-12-16 ENCOUNTER — Other Ambulatory Visit (INDEPENDENT_AMBULATORY_CARE_PROVIDER_SITE_OTHER): Payer: Self-pay | Admitting: Surgery

## 2016-12-31 NOTE — Telephone Encounter (Signed)
Please advise 

## 2017-11-20 IMAGING — CR DG LUMBAR SPINE 2-3V
2 series · 2 of 2 positions shown · non-contrast
Comparison: MRI 02/21/2016

CLINICAL DATA: Localization for L4-L5 decompression surgery.

EXAM:
LUMBAR SPINE - 2-3 VIEW

[lateral (1 of 2)]
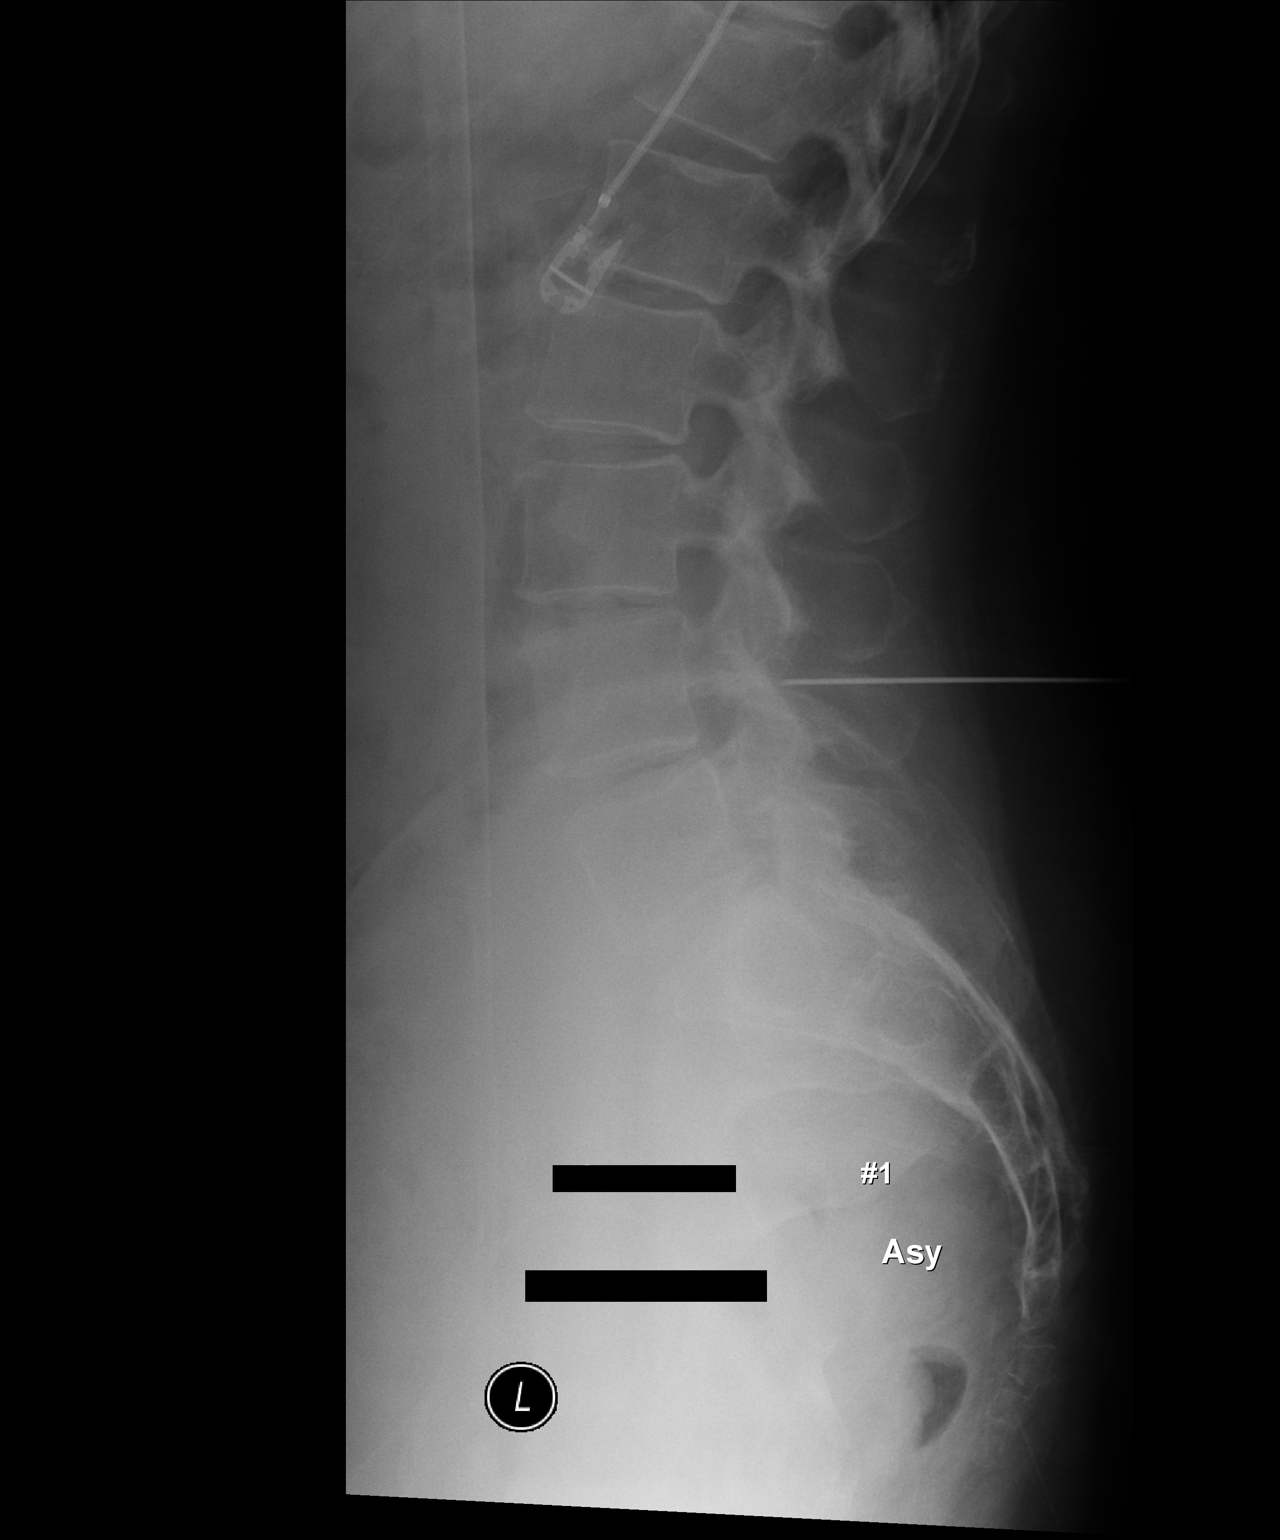

[lateral (2 of 2)]
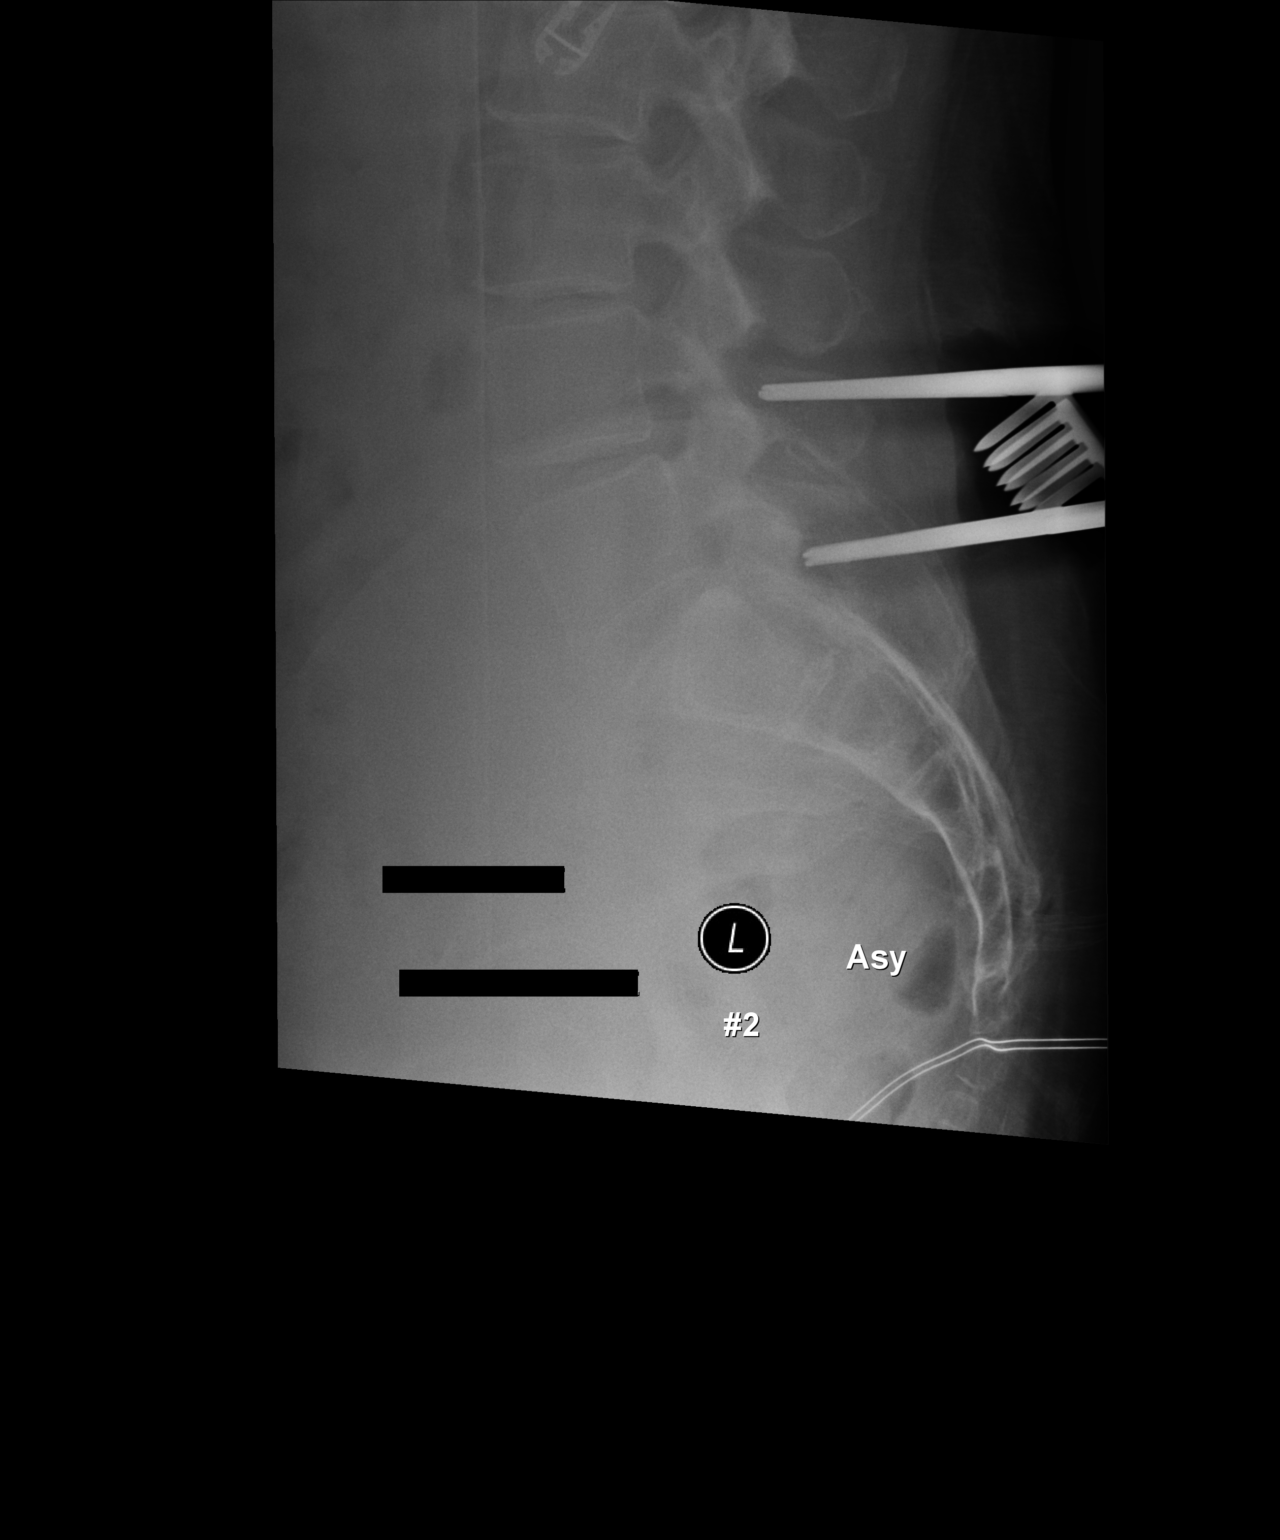

[2 of 2 positions shown; findings below may reference images not displayed]

FINDINGS: Spine is labeled in a similar fashion to the previous MRI study.
Surgical localization along the posterior aspect of L4 on the first
image. Surgical marking along the posterior aspect of L4-L5 and
L5-S1 on the second image.
IMPRESSION: Surgical localization of the lumbar spine as described.
